# Patient Record
Sex: Female | Born: 1935 | Race: White | Hispanic: No | Marital: Married | Smoking: Never smoker
Health system: Southern US, Community
[De-identification: ages and names within clinical notes are randomized; demographics above are authoritative.]

## PROBLEM LIST (undated history)

## (undated) DIAGNOSIS — G2581 Restless legs syndrome: Secondary | ICD-10-CM

## (undated) DIAGNOSIS — F419 Anxiety disorder, unspecified: Secondary | ICD-10-CM

## (undated) DIAGNOSIS — G629 Polyneuropathy, unspecified: Secondary | ICD-10-CM

## (undated) DIAGNOSIS — D0512 Intraductal carcinoma in situ of left breast: Secondary | ICD-10-CM

## (undated) DIAGNOSIS — Q613 Polycystic kidney, unspecified: Secondary | ICD-10-CM

## (undated) DIAGNOSIS — I10 Essential (primary) hypertension: Secondary | ICD-10-CM

## (undated) DIAGNOSIS — C50912 Malignant neoplasm of unspecified site of left female breast: Secondary | ICD-10-CM

## (undated) HISTORY — PX: COLONOSCOPY: SHX174

## (undated) HISTORY — PX: BREAST LUMPECTOMY: SHX2

## (undated) HISTORY — PX: TUBAL LIGATION: SHX77

## (undated) HISTORY — PX: TONSILLECTOMY: SUR1361

## (undated) HISTORY — PX: ABDOMINAL HYSTERECTOMY: SHX81

## (undated) HISTORY — DX: Intraductal carcinoma in situ of left breast: D05.12

## (undated) HISTORY — PX: BREAST BIOPSY: SHX20

## (undated) HISTORY — PX: APPENDECTOMY: SHX54

---

## 2013-03-06 HISTORY — PX: CARDIAC CATHETERIZATION: SHX172

## 2016-10-11 ENCOUNTER — Other Ambulatory Visit: Payer: Self-pay | Admitting: Hematology & Oncology

## 2016-10-11 DIAGNOSIS — R928 Other abnormal and inconclusive findings on diagnostic imaging of breast: Secondary | ICD-10-CM

## 2016-10-23 ENCOUNTER — Telehealth: Payer: Self-pay | Admitting: Diagnostic Radiology

## 2016-10-23 NOTE — Telephone Encounter (Signed)
I spoke with Dr. Jodelle Green about Valerie Guerrero.  He reports that she has had a left lumpectomy for breast cancer with adjuvant chemotherapy and Letrozole.  He reports a left breast nodule and that she had a diagnostic imaging work-up at [place] with a BiRads 0 and MR of the breasts recommended.  However, she has a Cr of 1.9 with a GFR of 30 and is not a candidate for MR with contrast.  He will schedule her for a Rad Eval at the [facility].  He will have the patient bring her current and previous mammogram and [place] images from [place] on a CD with her.

## 2016-10-24 ENCOUNTER — Ambulatory Visit
Admission: RE | Admit: 2016-10-24 | Discharge: 2016-10-24 | Disposition: A | Discharge status: Home / Self Care | Payer: Medicare (Managed Care) | Source: Ambulatory Visit | Attending: Hematology & Oncology | Admitting: Hematology & Oncology

## 2016-10-24 ENCOUNTER — Other Ambulatory Visit: Payer: Self-pay

## 2016-10-24 ENCOUNTER — Other Ambulatory Visit: Payer: Self-pay | Admitting: Hematology & Oncology

## 2016-10-24 ENCOUNTER — Ambulatory Visit: Payer: Medicare (Managed Care)

## 2016-10-24 ENCOUNTER — Ambulatory Visit
Admission: RE | Admit: 2016-10-24 | Discharge: 2016-10-24 | Disposition: A | Discharge status: Home / Self Care | Payer: Medicare (Managed Care) | Source: Ambulatory Visit

## 2016-10-24 DIAGNOSIS — R928 Other abnormal and inconclusive findings on diagnostic imaging of breast: Secondary | ICD-10-CM

## 2016-10-24 DIAGNOSIS — R921 Mammographic calcification found on diagnostic imaging of breast: Secondary | ICD-10-CM

## 2016-11-15 ENCOUNTER — Encounter (HOSPITAL_COMMUNITY): Payer: Medicare (Managed Care) | Attending: Hematology & Oncology | Admitting: Hematology & Oncology

## 2016-11-15 ENCOUNTER — Telehealth: Payer: Self-pay | Admitting: *Deleted

## 2016-11-15 VITALS — BP 160/64 | HR 63 | Temp 97.5°F | Resp 16 | Ht 63.0 in | Wt 149.4 lb

## 2016-11-15 DIAGNOSIS — D0512 Intraductal carcinoma in situ of left breast: Secondary | ICD-10-CM | POA: Diagnosis not present

## 2016-11-15 DIAGNOSIS — Z853 Personal history of malignant neoplasm of breast: Secondary | ICD-10-CM | POA: Diagnosis not present

## 2016-11-15 DIAGNOSIS — Z8041 Family history of malignant neoplasm of ovary: Secondary | ICD-10-CM | POA: Diagnosis not present

## 2016-11-15 DIAGNOSIS — Z79811 Long term (current) use of aromatase inhibitors: Secondary | ICD-10-CM

## 2016-11-15 NOTE — Patient Instructions (Signed)
[place] at [facility] Discharge Instructions  RECOMMENDATIONS MADE BY THE CONSULTANT AND ANY TEST RESULTS WILL BE SENT TO YOUR REFERRING PHYSICIAN.  You were seen today by Dr. Jenne Pane appointment at [facility] 1/26 @1pm  [place] will be in contact with you about genetic testing results  We will schedule you for a mammogram to re-evaluate clip placement Follow up after surgery with Dr. Donne Hazel  Thank you for choosing [place] at [facility] to provide your oncology and hematology care.  To afford each patient quality time with our provider, please arrive at least 15 minutes before your scheduled appointment time.    If you have a lab appointment with the [facility] please come in thru the  Main Entrance and check in at the main information desk  You need to re-schedule your appointment should you arrive 10 or more minutes late.  We strive to give you quality time with our providers, and arriving late affects you and other patients whose appointments are after yours.  Also, if you no show three or more times for appointments you may be dismissed from the clinic at the providers discretion.     Again, thank you for choosing [facility].  Our hope is that these requests will decrease the amount of time that you wait before being seen by our physicians.       _____________________________________________________________  Should you have questions after your visit to [facility], please contact our office at [phone] between the hours of 8:30 a.m. and 4:30 p.m.  Voicemails left after 4:30 p.m. will not be returned until the following business day.  For prescription refill requests, have your pharmacy contact our office.       Resources For Cancer Patients and their Caregivers ? American Cancer Society: Can assist with transportation, wigs, general needs, runs Look Good Feel Better.         8060313840 ? Cancer Care: Provides financial assistance, online support groups, medication/co-pay assistance.  1-800-813-HOPE 661-815-1406) ? [facility] Assists [place] cancer patients and their families through emotional , educational and financial support.  (614)537-8020 ? [place] DSS Where to apply for food stamps, Medicaid and utility assistance. 989-390-2544 ? [facility]: Transportation to medical appointments. 984-790-5781 ? Social Security Administration: May apply for disability if have a Stage IV cancer. (337)705-6449 864-411-0254 ? [employer], Disability and Transit Services: Assists with nutrition, care and transit needs. [place] Support Programs: @10RELATIVEDAYS @ > Cancer Support Group  2nd Tuesday of the month 1pm-2pm, Journey Room  > Creative Journey  3rd Tuesday of the month 1130am-1pm, Journey Room  > Look Good Feel Better  1st Wednesday of the month 10am-12 noon, Journey Room (Call [place] to register [phone])

## 2016-11-15 NOTE — Progress Notes (Signed)
[place]  CONSULT NOTE  No care team member to display  CHIEF COMPLAINTS/PURPOSE OF CONSULTATION:  DCIS Left Breast, prior lumpectomy site   HISTORY OF PRESENTING ILLNESS:  Valerie Guerrero 83 y.o. female is here because of referral by Dr. Rolm Bookbinder M.D. For new dx of left breast cancer, DCIS.  She notes that 18 years ago she had a L lumpectomy in [place] and took Tamoxifen. She notes that this was for DCIS In December 2016 she underwent a Left lumpectomy with Dr. Audrie Lia and an ALND (she notes all nodes were positive). She was treated with chemotherapy X 4 cycles and radiation. She also reports that there was a suspicious nodule on CT imaging in her lung but it was PET negative.. She then took letrozole.   She has just undergone mammography which has now shown microcalcs in the vicinity of her prior lumpectomy site. R breast appeared WNL. Core needle biopsy performed at the breast center showed high grade DCIS ER+ PR- disease. She has seen Dr. Donne Hazel. Plan is for genetics consultation and then mastectomy potentially L sided only depending upon genetics results. Note that her family history is significant. Her daughter currently has stage IV breast cancer, and was originally diagnosed prior to age 72.   She presents accompanied by her husband and daughter for discussion of left breast cancer and treatment options. I personally reviewed and went over records/notes and imaging with the patient.  States a scar in about the same place as her last lumpectomy. States she went to [facility] for genetic testing  Original lumpectomy was in [place] and took tamoxifen for it.   She has had a hysterectomy but she still has her ovaries. They have tried twice to remove them.   States her granddaughter had kidney cancer at 63. Other granddaughter had a melanoma in situ.   She would rather wait until after surgery to change her Letrozole.  Dr. Joneen Caraway did her biopsy in [place] and  also placed a clip there.   She notes old redness on her breast from radiation.   She has still not heard back in regards to her genetics appointment.   MEDICAL HISTORY:  No past medical history on file.  SURGICAL HISTORY: No past surgical history on file.  SOCIAL HISTORY: Social History   Social History  . Marital status: Married    Spouse name: N/A  . Number of children: N/A  . Years of education: N/A   Occupational History  . Not on file.   Social History Main Topics  . Smoking status: Not on file  . Smokeless tobacco: Not on file  . Alcohol use Not on file  . Drug use: Unknown  . Sexual activity: Not on file   Other Topics Concern  . Not on file   Social History Narrative  . No narrative on file   Social hx: Married 61 years in 02-Jan-2003 children, 1 deceased 6 grand children 5 great grandchildren Used to drive a school bus for 30 years  Non-smoker occassionally alcohol drinking Hobbies: watching television, 20 years ago enjoyed walking  FAMILY HISTORY: No family history on file.   Family hx:  Mom-86 - deceased -ovarian cancer - late onset  Dad - 38 - deceased of broken heart 1 sister - deceased 5 years ago from COPD- had parkinson's, was a chain smoker  States her granddaughter had kidney cancer at 67. Other granddaughter had a melanoma in situ.   ALLERGIES:  is  allergic to tape.  MEDICATIONS:  Current Outpatient Prescriptions  Medication Sig Dispense Refill  . ALPRAZolam (XANAX) 0.25 MG tablet Take 0.25 mg by mouth 2 (two) times daily as needed for anxiety.    . cholecalciferol (VITAMIN D) 1000 units tablet Take 1,000 Units by mouth daily.    Marland Kitchen denosumab (PROLIA) 60 MG/ML SOLN injection Inject 60 mg into the skin every 6 (six) months. Administer in upper arm, thigh, or abdomen    . prochlorperazine (COMPAZINE) 5 MG tablet Take 5 mg by mouth every 6 (six) hours as needed for nausea or vomiting.    Marland Kitchen zolpidem (AMBIEN) 10 MG tablet Take 10 mg  by mouth at bedtime as needed for sleep.    Marland Kitchen CALCIUM 500/D 500-400 MG-UNIT tablet   3  . hydrALAZINE (APRESOLINE) 50 MG tablet Take 50 mg by mouth 2 (two) times daily.  1  . letrozole (FEMARA) 2.5 MG tablet Take 25 mg by mouth daily.  0  . metoprolol (LOPRESSOR) 50 MG tablet Take 50 mg by mouth 2 (two) times daily.  1  . oxyCODONE (OXY IR/ROXICODONE) 5 MG immediate release tablet Take 5 mg by mouth as needed.  0   No current facility-administered medications for this visit.     Review of Systems  Constitutional: Negative.   HENT: Negative.   Eyes: Negative.   Respiratory: Negative.   Cardiovascular: Negative.   Gastrointestinal: Negative.   Genitourinary: Negative.   Musculoskeletal: Negative.   Skin: Negative.        Old redness on her breasts from radiation.  Neurological: Negative.   Endo/Heme/Allergies: Negative.   Psychiatric/Behavioral: Negative.   All other systems reviewed and are negative.  14 point ROS was done and is otherwise as detailed above or in HPI   PHYSICAL EXAMINATION: ECOG PERFORMANCE STATUS: 0 - Asymptomatic  Vitals:   11/15/16 0916  BP: (!) 160/64  Pulse: 63  Resp: 16  Temp: 97.5 F (36.4 C)   Filed Weights   11/15/16 0916  Weight: 149 lb 6.4 oz (67.8 kg)    Physical Exam  Constitutional: She is oriented to person, place, and time and well-developed, well-nourished, and in no distress.  HENT:  Head: Normocephalic and atraumatic.  Mouth/Throat: No oropharyngeal exudate.  Eyes: Conjunctivae and EOM are normal. Pupils are equal, round, and reactive to light. No scleral icterus.  Neck: Normal range of motion. Neck supple.  Cardiovascular: Normal rate, regular rhythm and normal heart sounds.   Pulmonary/Chest: Effort normal and breath sounds normal. No respiratory distress.    Abdominal: Soft. Bowel sounds are normal. She exhibits no distension and no mass. There is no tenderness. There is no rebound and no guarding.  Musculoskeletal: Normal  range of motion. She exhibits no edema.  Lymphadenopathy:    She has no cervical adenopathy.  Neurological: She is alert and oriented to person, place, and time. Gait normal.  Skin: Skin is warm and dry.  Psychiatric: Mood, memory, affect and judgment normal.  Nursing note and vitals reviewed.   LABORATORY DATA:  I have reviewed the data as listed No results found for: WBC, HGB, HCT, MCV, PLT CMP  No results found for: NA, K, CL, CO2, GLUCOSE, BUN, CREATININE, CALCIUM, PROT, ALBUMIN, AST, ALT, ALKPHOS, BILITOT, GFRNONAA, GFRAA   RADIOGRAPHIC STUDIES: I have personally reviewed the radiological images as listed and agreed with the findings in the report. No results found.   Addendum   ADDENDUM REPORT: 10/25/2016 12:04  ADDENDUM: Pathology revealed high grade ductal  carcinoma in situ with calcifications and necrosis in the left breast. This was found to be concordant by Dr. Claudie Revering. Pathology results were discussed with the patient by telephone. The patient reported doing well after the biopsy. Post biopsy instructions and care were reviewed and questions were answered. The patient was encouraged to call [facility] for any additional concerns. The patient requests surgical consultation in [place] and this has been arranged with Dr. Rolm Bookbinder at [facility] on November 02, 2016.  Pathology results reported by Susa Raring RN, BSN on 10/25/2016.   Electronically Signed   By: Claudie Revering M.D.   On: 10/25/2016 12:04   Addended by Enrique Sack, MD on 10/25/2016 2:21 PM    Study Result   CLINICAL DATA:  7 mm group of suspicious microcalcifications in the upper outer left breast at recent mammography. Status post left lumpectomy for invasive ductal carcinoma in this portion of the breast 1 year ago, followed by radiation therapy and chemotherapy.  EXAM: LEFT BREAST STEREOTACTIC CORE NEEDLE  BIOPSY  COMPARISON:  Previous exams.  FINDINGS: The patient and I discussed the procedure of stereotactic-guided biopsy including benefits and alternatives. We discussed the high likelihood of a successful procedure. We discussed the risks of the procedure including infection, bleeding, tissue injury, clip migration, and inadequate sampling. Informed written consent was given. The usual time out protocol was performed immediately prior to the procedure.  Using sterile technique and 1% Lidocaine as local anesthetic, under stereotactic guidance, a 9 gauge vacuum assisted device was used to perform core needle biopsy of the recently demonstrated 7 mm group of microcalcifications in the upper-outer quadrant of the left breast using a cephalad approach. Specimen radiograph was performed showing multiple calcifications in multiple specimens. Specimens with calcifications are identified for pathology.  At the conclusion of the procedure, a coil shaped tissue marker clip was deployed into the biopsy cavity. Follow-up 2-view mammogram was performed and dictated separately.  IMPRESSION: Stereotactic-guided biopsy of a 7 mm group of suspicious microcalcifications in the upper-outer left breast. No apparent complications.  Electronically Signed: By: Claudie Revering M.D. On: 10/24/2016 16:31     Study Result   CLINICAL DATA:  Status post stereotactic guided core needle biopsy of a 7 mm group of suspicious microcalcifications in the upper outer left breast.  EXAM: DIAGNOSTIC LEFT MAMMOGRAM POST STEREOTACTIC BIOPSY  COMPARISON:  Previous exam(s).  FINDINGS: Mammographic images were obtained following stereotactic guided biopsy of a 7 mm group of suspicious microcalcifications in the upper-outer left breast. These demonstrate a coil shaped biopsy marker clip at the location of the biopsied calcifications. All but one of the calcifications are absent following  biopsy.  IMPRESSION: Appropriate clip deployment following left breast stereotactic guided core needle biopsy.  Final Assessment: Post Procedure Mammograms for Marker Placement   Electronically Signed   By: Claudie Revering M.D.   On: 10/24/2016 16:32    PATHOLOGY:     ASSESSMENT & PLAN:  History of locally advanced L breast cancer s/p lumpectomy, chemotherapy, XRT, letrozole History of DCIS, tamoxifen L breast Recurrence High grade DCIS L breast Family history of Breast, ovarian cancer  WE discussed changing her endocrine therapy to aromasin. She wants to currently wait until after her surgery.   I have called to make sure her genetics appointment is scheduled. Final surgical options regarding double mastectomy will be made once genetics are available. She will talk more about this with Dr. Donne Hazel. Currently plan  is for Left mastectomy.   Patient notes an abnormal metal like "marker" that came off the surface of her breast. I have ordered a L diagnostic mammogram to check on the clip that was placed. I anticipate that this is likely fine.   RTC post-surgery with Dr. Donne Hazel.  ORDERS PLACED FOR THIS ENCOUNTER: Orders Placed This Encounter  Procedures  . MM DIAG BREAST TOMO UNI LEFT    MEDICATIONS PRESCRIBED THIS ENCOUNTER: Meds ordered this encounter  Medications  . CALCIUM 500/D 500-400 MG-UNIT tablet    Refill:  3  . hydrALAZINE (APRESOLINE) 50 MG tablet    Sig: Take 50 mg by mouth 2 (two) times daily.    Refill:  1  . metoprolol (LOPRESSOR) 50 MG tablet    Sig: Take 50 mg by mouth 2 (two) times daily.    Refill:  1  . letrozole (FEMARA) 2.5 MG tablet    Sig: Take 25 mg by mouth daily.    Refill:  0  . oxyCODONE (OXY IR/ROXICODONE) 5 MG immediate release tablet    Sig: Take 5 mg by mouth as needed.    Refill:  0  . zolpidem (AMBIEN) 10 MG tablet    Sig: Take 10 mg by mouth at bedtime as needed for sleep.  Marland Kitchen ALPRAZolam (XANAX) 0.25 MG tablet    Sig:  Take 0.25 mg by mouth 2 (two) times daily as needed for anxiety.  . prochlorperazine (COMPAZINE) 5 MG tablet    Sig: Take 5 mg by mouth every 6 (six) hours as needed for nausea or vomiting.  Marland Kitchen denosumab (PROLIA) 60 MG/ML SOLN injection    Sig: Inject 60 mg into the skin every 6 (six) months. Administer in upper arm, thigh, or abdomen  . cholecalciferol (VITAMIN D) 1000 units tablet    Sig: Take 1,000 Units by mouth daily.    This document serves as a record of services personally performed by Ancil Linsey, MD. It was created on her behalf by Shirlean Mylar, a trained medical scribe. The creation of this record is based on the scribe's personal observations and the provider's statements to them. This document has been checked and approved by the attending provider.  I have reviewed the above documentation for accuracy and completeness and I agree with the above.  Kelby Fam. [place]land, M.D. This note was electronically signed.  11/15/2016 3:15 PM

## 2016-11-15 NOTE — Telephone Encounter (Signed)
Received notification from Dr. Whitney Muse pt needed urgent genetics. Called [facility] navigator with an appt for genetics on 11/16/16 at 1pm to give to the pt. Called pt and left vm requesting return call to discuss appt date and time. Contact information provided.

## 2016-11-16 ENCOUNTER — Other Ambulatory Visit: Payer: Self-pay

## 2016-11-16 ENCOUNTER — Ambulatory Visit (HOSPITAL_BASED_OUTPATIENT_CLINIC_OR_DEPARTMENT_OTHER): Payer: Medicare (Managed Care)

## 2016-11-16 DIAGNOSIS — Z8041 Family history of malignant neoplasm of ovary: Secondary | ICD-10-CM | POA: Diagnosis not present

## 2016-11-16 DIAGNOSIS — Z7183 Encounter for nonprocreative genetic counseling: Secondary | ICD-10-CM | POA: Diagnosis not present

## 2016-11-16 DIAGNOSIS — Z853 Personal history of malignant neoplasm of breast: Secondary | ICD-10-CM | POA: Diagnosis not present

## 2016-11-17 DIAGNOSIS — Z8041 Family history of malignant neoplasm of ovary: Secondary | ICD-10-CM | POA: Insufficient documentation

## 2016-11-17 DIAGNOSIS — Z853 Personal history of malignant neoplasm of breast: Secondary | ICD-10-CM | POA: Insufficient documentation

## 2016-11-17 NOTE — Progress Notes (Signed)
REFERRING PROVIDER: Dr. Ancil Linsey  PRIMARY PROVIDER:  No primary care provider on file.  PRIMARY REASON FOR VISIT:  1. Personal history of breast cancer   2. FH: ovarian cancer     HISTORY OF PRESENT ILLNESS:   Ms. Valerie Guerrero, a 83 y.o. female, was seen for a [place] cancer genetics consultation at the request of Dr. Whitney Muse due to a personal and family history of cancer.  Valerie Guerrero presents to clinic today to discuss the possibility of a hereditary predisposition to cancer, genetic testing, and to further clarify her future cancer risks, as well as potential cancer risks for family members. She was accompanied by her husband, Valerie Guerrero.  Around 1998, at the age of 89, Valerie Guerrero reports that she was diagnosed with DCIS of the left breast. This was treated with lumpectomy and Tamoxifen for 5 years. In December 2016, at the age of 49, Valerie Guerrero was diagnosed with invasive breast cancer of the left breast. She reports that 14/14 lymph nodes were positive for disease. She was treated with chemotherapy and radiation. Recently, in December of 2017, at the age of 27, she was again diagnosed with DCIS of the left breast. Her physician is recommending mastectomy but wishes to have the results of genetic testing before proceeding with surgery.   CANCER HISTORY:   No history exists.     HORMONAL RISK FACTORS:   Ovaries intact: yes.  Hysterectomy: yes.  Menopausal status: postmenopausal.  HRT use: not assessed. Colonoscopy: yes; normal.   Past Medical History:  Diagnosis Date  . Breast cancer ([place]) AGE 58   Left breast  . Ductal carcinoma in situ (DCIS) of left breast    AGE 89  . Ductal carcinoma in situ (DCIS) of left breast    Age 37    Past Surgical History:  Procedure Laterality Date  . ABDOMINAL HYSTERECTOMY     30 years ago, ovaries intact  . BREAST LUMPECTOMY Left    20 years ago    Social History   Social History  . Marital status: Married    Spouse name: N/A  .  Number of children: N/A  . Years of education: N/A   Social History Main Topics  . Smoking status: Not on file  . Smokeless tobacco: Not on file  . Alcohol use Not on file  . Drug use: Unknown  . Sexual activity: Not on file   Other Topics Concern  . Not on file   Social History Narrative  . No narrative on file     FAMILY HISTORY:  We obtained a detailed, 4-generation family history.  Significant diagnoses are listed below: Family History  Problem Relation Age of Onset  . Cancer Mother 50    ovarian  . Parkinson's disease Father   . Parkinson's disease Sister   . Cancer Maternal Aunt 78    ovarian  . Cancer Paternal Aunt   . Parkinson's disease Maternal Grandfather   . Cancer Daughter 65    breast  . Cancer Grandchild 25    melanoma  . Cancer Grandchild 30    kidney  . Cancer Cousin     prostate  . Cancer Cousin     ovarian    Valerie Guerrero reports that she had genetic testing for hereditary cancer risks approximately 20 years ago at [facility] but she was unsure of which genes she may have been tested for. Patient's maternal ancestors are of [place] descent, and paternal ancestors are of English descent. There  is no reported Ashkenazi Jewish ancestry.  GENETIC COUNSELING ASSESSMENT: Valerie Guerrero is a 83 y.o. female with a personal and family history which is somewhat suggestive of a predisposition to cancer. We, therefore, discussed and recommended the following at today's visit.   DISCUSSION: We reviewed the characteristics, features and inheritance patterns of hereditary cancer syndromes. We also discussed genetic testing, including the appropriate family members to test, the process of testing, insurance coverage and turn-around-time for results. We discussed the implications of a negative, positive and/or variant of uncertain significant result. We recommended Valerie Guerrero pursue genetic testing for the breast and gynecological gene panel.   Based on Ms. Gilberg's personal and  family history of cancer, she meets medical criteria for genetic testing. Despite that she meets criteria, she may still have an out of pocket cost. We discussed that if her out of pocket cost for testing is over $100, the laboratory will call and confirm whether she wants to proceed with testing.  If the out of pocket cost of testing is less than $100 she will be billed by the genetic testing laboratory.   We reviewed the characteristics, features and inheritance patterns of hereditary cancer syndromes. We also discussed genetic testing, including the appropriate family members to test, the process of testing, insurance coverage and turn-around-time for results. We discussed the implications of a negative, positive and/or variant of uncertain significant result.   PLAN: After considering the risks, benefits, and limitations, Valerie Guerrero  provided informed consent to pursue genetic testing and the blood sample was sent to [facility] for analysis of the breast/gyn cancers panel test. Results should be available within approximately 2 weeks' time, at which point they will be disclosed by telephone to Valerie Guerrero, as will any additional recommendations warranted by these results. Valerie Guerrero will receive a summary of her genetic counseling visit and a copy of her results once available. This information will also be available in Epic. We encouraged Valerie Guerrero to remain in contact with cancer genetics annually so that we can continuously update the family history and inform her of any changes in cancer genetics and testing that may be of benefit for her family. Valerie Guerrero questions were answered to her satisfaction today. Our contact information was provided should additional questions or concerns arise.  Ms.  Guerrero questions were answered to her satisfaction today. Our contact information was provided should additional questions or concerns arise. Thank you for the referral and allowing [place] to share in  the care of your patient.   Benay Pike, MS, [facility] Certified Genetic Counselor  The patient was seen for a total of approximately 45 minutes in face-to-face genetic counseling.  This patient was discussed with Drs. Magrinat, Lindi Adie and/or Burr Medico who agrees with the above.    _______________________________________________________________________ For Office Staff:  Number of people involved in session: 3 Was an Intern/ student involved with case: no

## 2016-11-27 ENCOUNTER — Ambulatory Visit (HOSPITAL_COMMUNITY)
Admission: RE | Admit: 2016-11-27 | Discharge: 2016-11-27 | Disposition: A | Discharge status: Home / Self Care | Payer: Medicare Other | Source: Ambulatory Visit | Attending: Hematology & Oncology | Admitting: Hematology & Oncology

## 2016-11-27 ENCOUNTER — Encounter (HOSPITAL_COMMUNITY): Payer: Self-pay | Admitting: Hematology & Oncology

## 2016-11-27 DIAGNOSIS — D0512 Intraductal carcinoma in situ of left breast: Secondary | ICD-10-CM | POA: Insufficient documentation

## 2016-11-29 ENCOUNTER — Encounter: Payer: Self-pay | Admitting: Genetic Counselor

## 2016-11-29 ENCOUNTER — Ambulatory Visit: Payer: Self-pay | Admitting: Genetic Counselor

## 2016-11-29 ENCOUNTER — Telehealth: Payer: Self-pay | Admitting: Genetic Counselor

## 2016-11-29 DIAGNOSIS — Z853 Personal history of malignant neoplasm of breast: Secondary | ICD-10-CM

## 2016-11-29 DIAGNOSIS — Z1379 Encounter for other screening for genetic and chromosomal anomalies: Secondary | ICD-10-CM | POA: Insufficient documentation

## 2016-11-29 DIAGNOSIS — Z8041 Family history of malignant neoplasm of ovary: Secondary | ICD-10-CM

## 2016-11-29 DIAGNOSIS — D0512 Intraductal carcinoma in situ of left breast: Secondary | ICD-10-CM

## 2016-11-30 ENCOUNTER — Ambulatory Visit: Payer: Self-pay | Admitting: Genetic Counselor

## 2016-11-30 NOTE — Progress Notes (Signed)
[facility]    Patient Name: Valerie Guerrero Patient DOB: [birth year] Patient Age: 83 y.o. Encounter Date: 11/29/2016  Referring Provider: Ancil Linsey, MD  Primary Care Provider: No primary care provider on file.  [name] was called today to discuss genetic test results. Please see the Genetics note from her visit on 11/16/16 for a detailed discussion of her personal and family history.  Genetic Testing: At the time of [name]'s visit, we recommended she pursue genetic testing of multiple genes associated with a hereditary predisposition to cancer. She was tested via the Breast/GYN gene panel offered by [facility], which included sequencing and rearrangement analysis for the following 23 genes:  ATM, BARD1, BRCA1, BRCA2, BRIP1, CDH1, CHEK2, EPCAM, FANCC, MLH1, MSH2, MSH6, MUTYH, NBN, NF1, PALB2, PMS2, POLD1, PTEN, RAD51C, RAD51D, RECQL, and TP53. Testing was normal and did not reveal a mutation in these genes. A copy of the genetic test report will be scanned into Epic under the media tab.  Since the current test is not perfect, it is possible there may be a gene mutation that current testing cannot detect, but that chance is small. We also discussed that it is possible that a different genetic factor, which was not part of this testing or has not yet been discovered, is responsible for the cancer diagnoses in the family. Again, the likelihood of this is low. No additional testing is recommended at this time.   Cancer Screening: Given the personal and family histories, we must interpret these negative results with some caution. Families with features suggestive of hereditary risk tend to have multiple family members with cancer, diagnoses in multiple generations and diagnoses before the age of 36. [name] family exhibits some of these features. This result may simply reflect our current inability to detect all mutations within these genes. It  is also possible there is a different gene, that we have not yet tested, that may be responsible for the cancer in this family.   [name] may be at increased risk for ovarian cancer given her family history of the disease. While there is no effective screening options at this time, some women decide to have their ovaries surgically removed to reduce the chance of ovarian cancer. We recommended [name] discuss these options further with her gynecologist.  Family Members: Given [name]'s and her daughter's breast cancer diagnoses, her other daughters would be considered at increased risk for breast cancer. They may wish to discuss with their own provider whether there is a benefit to having a breast MRI in addition to mammograms. Women should also have a clinical breast exam every 6-12 months, a yearly gynecologic exam and perform monthly breast self-exams. Colon cancer screening is recommended to begin by age 27 in both men and women.  Any relative who had cancer at a young age or had a particularly rare cancer may also wish to pursue genetic testing. Genetic counselors can be located in other cities, by visiting the website of the [employer] (ArtistMovie.se) and Field seismologist for a Dietitian by zip code.   Lastly, cancer genetics is a rapidly advancing field and it is possible that new genetic tests will be appropriate for her in the future. We encourage her to remain in contact with [place] on an annual basis so we can update her personal and family histories, and let her know of advances in cancer genetics that may benefit the  family. Our contact number was provided. [name] is welcome to call anytime with additional questions.    [name], MS, [facility] Certified Genetic Counselor phone: [phone] [email or web address]

## 2016-11-30 NOTE — Telephone Encounter (Signed)
Patient called back and results were discussed.

## 2016-12-04 NOTE — Progress Notes (Signed)
Encounter created in error

## 2016-12-05 ENCOUNTER — Other Ambulatory Visit: Payer: Self-pay | Admitting: General Surgery

## 2016-12-13 ENCOUNTER — Encounter (HOSPITAL_COMMUNITY)
Admission: RE | Admit: 2016-12-13 | Discharge: 2016-12-13 | Disposition: A | Discharge status: Home / Self Care | Payer: Medicare Other | Source: Ambulatory Visit | Attending: General Surgery | Admitting: General Surgery

## 2016-12-13 ENCOUNTER — Encounter (HOSPITAL_COMMUNITY): Payer: Self-pay

## 2016-12-13 DIAGNOSIS — C50912 Malignant neoplasm of unspecified site of left female breast: Secondary | ICD-10-CM | POA: Insufficient documentation

## 2016-12-13 DIAGNOSIS — Z01818 Encounter for other preprocedural examination: Secondary | ICD-10-CM | POA: Diagnosis present

## 2016-12-13 DIAGNOSIS — I10 Essential (primary) hypertension: Secondary | ICD-10-CM | POA: Diagnosis not present

## 2016-12-13 DIAGNOSIS — R9431 Abnormal electrocardiogram [ECG] [EKG]: Secondary | ICD-10-CM | POA: Diagnosis not present

## 2016-12-13 DIAGNOSIS — I444 Left anterior fascicular block: Secondary | ICD-10-CM | POA: Diagnosis not present

## 2016-12-13 HISTORY — DX: Essential (primary) hypertension: I10

## 2016-12-13 HISTORY — DX: Anxiety disorder, unspecified: F41.9

## 2016-12-13 HISTORY — DX: Polyneuropathy, unspecified: G62.9

## 2016-12-13 LAB — BASIC METABOLIC PANEL
ANION GAP: 9 (ref 5–15)
BUN: 16 mg/dL (ref 6–20)
CHLORIDE: 105 mmol/L (ref 101–111)
CO2: 25 mmol/L (ref 22–32)
Calcium: 9.1 mg/dL (ref 8.9–10.3)
Creatinine, Ser: 1.9 mg/dL — ABNORMAL HIGH (ref 0.44–1.00)
GFR calc Af Amer: 28 mL/min — ABNORMAL LOW (ref >60)
GFR calc non Af Amer: 24 mL/min — ABNORMAL LOW (ref >60)
GLUCOSE: 101 mg/dL — AB (ref 65–99)
POTASSIUM: 4.3 mmol/L (ref 3.5–5.1)
Sodium: 139 mmol/L (ref 135–145)

## 2016-12-13 LAB — CBC
HEMATOCRIT: 41.4 % (ref 36.0–46.0)
HEMOGLOBIN: 13.5 g/dL (ref 12.0–15.0)
MCH: 32.8 pg (ref 26.0–34.0)
MCHC: 32.6 g/dL (ref 30.0–36.0)
MCV: 100.7 fL — ABNORMAL HIGH (ref 78.0–100.0)
Platelets: 152 10*3/uL (ref 150–400)
RBC: 4.11 MIL/uL (ref 3.87–5.11)
RDW: 13 % (ref 11.5–15.5)
WBC: 4.9 10*3/uL (ref 4.0–10.5)

## 2016-12-13 NOTE — Pre-Procedure Instructions (Signed)
Valerie Guerrero  12/13/2016      [place], [place] [address] Phone: [phone] Fax: [phone]    Your procedure is scheduled on Wed, Feb 28 @ 8:30 AM  Report to [facility] Admitting at 6:30 AM  Call this number if you have problems the morning of surgery:  [phone]   Remember:  Do not eat food or drink liquids after midnight.  Take these medicines the morning of surgery with A SIP OF WATER Alprazolam(Xanax),Flonase(Fluticasone),Hydralazine(Apresoline),Metoprolol(Lopressor),Pain Pill(if needed),and Compazine(Prochlorperazine)             Stop taking any Vitamins or Herbal Medications now. No Goody's,BC's,Aleve,Advil,Motrin,ibuprofen,or Fish Oil.   Do not wear jewelry, make-up or nail polish.  Do not wear lotions, powders,  perfumes, or deoderant.  Do not shave 48 hours prior to surgery.    Do not bring valuables to the hospital.  [facility] is not responsible for any belongings or valuables.  Contacts, dentures or bridgework may not be worn into surgery.  Leave your suitcase in the car.  After surgery it may be brought to your room.  For patients admitted to the hospital, discharge time will be determined by your treatment team.  Patients discharged the day of surgery will not be allowed to drive home.    [facility]  Before surgery, you can play an important role.  Because skin is not sterile, your skin needs to be as free of germs as possible.  You can reduce the number of germs on you skin by washing with CHG (chlorahexidine gluconate) soap before surgery.  CHG is an antiseptic cleaner which kills germs and bonds with the skin to continue killing germs even after washing.  Please DO NOT use if you have an allergy to CHG or antibacterial soaps.  If your skin becomes reddened/irritated stop using the CHG and inform your nurse when you arrive at Short  Stay.  Do not shave (including legs and underarms) for at least 48 hours prior to the first CHG shower.  You may shave your face.  Please follow these instructions carefully:   1.  Shower with CHG Soap the night before surgery and the                                morning of Surgery.  2.  If you choose to wash your hair, wash your hair first as usual with your       normal shampoo.  3.  After you shampoo, rinse your hair and body thoroughly to remove the                      Shampoo.  4.  Use CHG as you would any other liquid soap.  You can apply chg directly       to the skin and wash gently with scrungie or a clean washcloth.  5.  Apply the CHG Soap to your body ONLY FROM THE NECK DOWN.        Do not use on open wounds or open sores.  Avoid contact with your eyes,       ears, mouth and genitals (private parts).  Wash genitals (private parts)       with your normal soap.  6.  Wash thoroughly, paying special attention to the area where  your surgery        will be performed.  7.  Thoroughly rinse your body with warm water from the neck down.  8.  DO NOT shower/wash with your normal soap after using and rinsing off       the CHG Soap.  9.  Pat yourself dry with a clean towel.            10.  Wear clean pajamas.            11.  Place clean sheets on your bed the night of your first shower and do not        sleep with pets.  Day of Surgery  Do not apply any lotions/deoderants the morning of surgery.  Please wear clean clothes to the hospital/surgery center.     Please read over the following fact sheets that you were given. Pain Booklet, Coughing and Deep Breathing and Surgical Site Infection Prevention

## 2016-12-13 NOTE — Pre-Procedure Instructions (Signed)
Wednesday Waltman  12/13/2016      [place], [place] [address] Phone: [phone] Fax: [phone]    Your procedure is scheduled on Wed, Feb 28 @ 8:30 AM  Report to [facility] Admitting at 6:30 AM  Call this number if you have problems the morning of surgery:  [phone]   Remember:  Do not eat food or drink liquids after midnight.  Take these medicines the morning of surgery with A SIP OF WATER: Alprazolam(Xanax), Flonase(Fluticasone), Hydralazine(Apresoline), Metoprolol(Lopressor), Pain Pill(if needed), and Compazine(Prochlorperazine)             Stop taking any Vitamins or Herbal Medications now. No Goody's,BC's,Aleve,Advil,Motrin,ibuprofen,or Fish Oil.   Do not wear jewelry, make-up or nail polish.  Do not wear lotions, powders,  perfumes, or deodorant.  Do not shave 48 hours prior to surgery.    Do not bring valuables to the hospital.  [facility] is not responsible for any belongings or valuables.  Contacts, dentures or bridgework may not be worn into surgery.  Leave your suitcase in the car.  After surgery it may be brought to your room.  For patients admitted to the hospital, discharge time will be determined by your treatment team.  Patients discharged the day of surgery will not be allowed to drive home.    [facility]  Before surgery, you can play an important role.  Because skin is not sterile, your skin needs to be as free of germs as possible.  You can reduce the number of germs on you skin by washing with CHG (chlorahexidine gluconate) soap before surgery.  CHG is an antiseptic cleaner which kills germs and bonds with the skin to continue killing germs even after washing.  Please DO NOT use if you have an allergy to CHG or antibacterial soaps.  If your skin becomes reddened/irritated stop using the CHG and inform your nurse when you arrive at  Short Stay.  Do not shave (including legs and underarms) for at least 48 hours prior to the first CHG shower.  You may shave your face.  Please follow these instructions carefully:   1.  Shower with CHG Soap the night before surgery and the morning of Surgery.  2.  If you choose to wash your hair, wash your hair first as usual with your normal shampoo.  3.  After you shampoo, rinse your hair and body thoroughly to remove the Shampoo.  4.  Use CHG as you would any other liquid soap.  You can apply chg directl to the skin and wash gently with scrungie or a clean washcloth.  5.  Apply the CHG Soap to your body ONLY FROM THE NECK DOWN.Do not use on open wounds or open sores.  Avoid contact with your eyes,       ears, mouth and genitals (private parts).  Wash genitals (private part  with your normal soap.  6.  Wash thoroughly, paying special attention to the area where your surgery will be performed.  7.  Thoroughly rinse your body with warm water from the neck down.  8.  DO NOT shower/wash with your normal soap after using and rinsing off the CHG Soap.  9.  Pat yourself dry with a clean towel.            10.  Wear clean pajamas.  11.  Place clean sheets on your bed the night of your first shower and do not sleep with pets.  Day of Surgery  Do not apply any lotions/deoderants the morning of surgery.  Please wear clean clothes to the hospital/surgery center.    Please read over the following fact sheets that you were given. Pain Booklet, Coughing and Deep Breathing and Surgical Site Infection Prevention

## 2016-12-13 NOTE — Progress Notes (Signed)
Valerie Guerrero reported that she had a stress test done in the past 5 years, she said that she did not have to have routine visits with cardiologist- Dr Alroy Dust. "I saw Dr Alroy Dust for my blood pressure."  I requested results from [facility]. Patient has a history of Glumerulonephritis- patient doesn't think she has seen a nephrologist.  The Dr said that if I had had it when I was younger that I would be dead."  I requested labs from [facility].

## 2016-12-14 NOTE — Progress Notes (Addendum)
Anesthesia Chart Review: Patient is an 83 year old female scheduled for left total mastectomy on 12/19/2016 by Dr. Donne Hazel. DX: Recurrent left breast cancer. Anesthesia is posted as general with pectoral block.  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  PCP is listed as Griffith Citron, NP [phone]). HEM-ONC is Dr. Ancil Linsey. Has also been seen at [facility] by Dr. Jodelle Green. She has seen cardiologist Dr. Delanna Notice in the past. Last visit was in 2014. She has seen nephrologist Dr. Shirline Frees in the past. Had acute on chronic kidney disease following cardiac cath (Cr peak ~ 3).   Meds include Xanax, Prolina (June/December), Flonase, hydralazine, Femara (on hold), Lopressor, OxyIR, Compazine, Ambien.  BP (!) 158/91   Pulse 62   Temp 36.7 C   Resp 18   Ht 5\' 4"  (1.626 m)   Wt 145 lb (65.8 kg)   SpO2 97%   BMI 24.89 kg/m   EKG 12/13/16: NSR, LAFB, non-specific ST/T wave abnormality.  By 04/14/13 note by Dr. Hewitt Blade: - Carotid U/S 03/07/09: Mild plaque, < 39% stenosis. - Stress Echo 03/05/13 [facility]): Lateral hypokinesis, increased ectopy, 1 mm ST depression. Cath recommended. - Cardiac cath 03/06/13 [facility]): EF not done, normal coronaries.  Preoperative labs noted. H/H 13.5/41.4. Glucose 101. BUN 16, Cr 1.90. This is consistent with comparison labs from 10/23/16 (scanned under Media tab) that show a BUN 20, Cr 1.95. In additional, according to Dr. Milta Deiters notes, patient has had known CKD dating back to at least 2013 with her Cr then ~ 1.50-1.70 with peak up to 3.2 following 2014 cardiac cath.   Will request stress, echo, cath, and EKG from [facility] but based on records  currently available I am anticipating that she can proceed as planned if no acute changes.  George Hugh [facility] Short Stay Center/Anesthesiology Phone [phone] 12/14/2016 6:15 PM  Addendum: 2014 records received from [facility] for 02/2013 admission for chest pain. Cath was essentially normal. Their records also list history of polycystic kidney disease.    - Exercise stress echo 03/06/13: Impression: Abnormal exercise stress test with lateral wall ischemia. Again, possible bounced 3-vessel disease. With generalized weakness, fatigue, SOB, and increased ventricular ectopy and 1.5 mm ST depression, the patient will be scheduled for cardiac cath. (See below for cath summary; I do not see an EF documented on the stress echo.)  - Cardiac cath 03/06/13: Impression: Essentially normal epicardial coronary arteries (apical LAD had 25% stenosis, otherwise normal coronaries). Normal left ventricular systolic function by echocardiogram. Patient will be continued on medical therapy. Risk factor modification.   George Hugh [facility] Short Stay Center/Anesthesiology Phone [phone] 12/17/2016 4:13 PM

## 2016-12-17 ENCOUNTER — Encounter (HOSPITAL_COMMUNITY): Payer: Self-pay

## 2016-12-19 ENCOUNTER — Ambulatory Visit (HOSPITAL_COMMUNITY): Payer: Medicare Other | Admitting: Emergency Medicine

## 2016-12-19 ENCOUNTER — Observation Stay (HOSPITAL_COMMUNITY)
Admission: RE | Admit: 2016-12-19 | Discharge: 2016-12-20 | Disposition: A | Discharge status: Home / Self Care | Payer: Medicare Other | Source: Ambulatory Visit | Attending: General Surgery | Admitting: General Surgery

## 2016-12-19 ENCOUNTER — Encounter (HOSPITAL_COMMUNITY)
Admission: RE | Disposition: A | Discharge status: Home / Self Care | Payer: Self-pay | Source: Ambulatory Visit | Attending: General Surgery

## 2016-12-19 ENCOUNTER — Ambulatory Visit (HOSPITAL_COMMUNITY): Payer: Medicare Other | Admitting: Certified Registered Nurse Anesthetist

## 2016-12-19 ENCOUNTER — Encounter (HOSPITAL_COMMUNITY): Payer: Self-pay | Admitting: *Deleted

## 2016-12-19 DIAGNOSIS — Z923 Personal history of irradiation: Secondary | ICD-10-CM | POA: Insufficient documentation

## 2016-12-19 DIAGNOSIS — I10 Essential (primary) hypertension: Secondary | ICD-10-CM | POA: Diagnosis not present

## 2016-12-19 DIAGNOSIS — Z8041 Family history of malignant neoplasm of ovary: Secondary | ICD-10-CM | POA: Insufficient documentation

## 2016-12-19 DIAGNOSIS — Z9221 Personal history of antineoplastic chemotherapy: Secondary | ICD-10-CM | POA: Diagnosis not present

## 2016-12-19 DIAGNOSIS — Z803 Family history of malignant neoplasm of breast: Secondary | ICD-10-CM | POA: Diagnosis not present

## 2016-12-19 DIAGNOSIS — D0512 Intraductal carcinoma in situ of left breast: Secondary | ICD-10-CM | POA: Diagnosis present

## 2016-12-19 DIAGNOSIS — C50912 Malignant neoplasm of unspecified site of left female breast: Secondary | ICD-10-CM | POA: Diagnosis present

## 2016-12-19 HISTORY — PX: MASTECTOMY: SHX3

## 2016-12-19 HISTORY — PX: TOTAL MASTECTOMY: SHX6129

## 2016-12-19 HISTORY — DX: Polycystic kidney, unspecified: Q61.3

## 2016-12-19 HISTORY — DX: Restless legs syndrome: G25.81

## 2016-12-19 HISTORY — DX: Malignant neoplasm of unspecified site of left female breast: C50.912

## 2016-12-19 SURGERY — MASTECTOMY, SIMPLE
Anesthesia: General | Site: Breast | Laterality: Left

## 2016-12-19 MED ORDER — CHLORHEXIDINE GLUCONATE CLOTH 2 % EX PADS: 6.0000 | MEDICATED_PAD | Freq: Once | CUTANEOUS | Status: DC

## 2016-12-19 MED ORDER — ONDANSETRON HCL 4 MG/2ML IJ SOLN: 4.0000 mg | Freq: Four times a day (QID) | INTRAMUSCULAR | Status: DC | PRN

## 2016-12-19 MED ORDER — LIDOCAINE 2% (20 MG/ML) 5 ML SYRINGE
INTRAMUSCULAR | Status: AC
  Filled 2016-12-19: qty 5

## 2016-12-19 MED ORDER — MIDAZOLAM HCL 2 MG/2ML IJ SOLN
INTRAMUSCULAR | Status: AC
  Filled 2016-12-19: qty 2

## 2016-12-19 MED ORDER — LIDOCAINE 2% (20 MG/ML) 5 ML SYRINGE
INTRAMUSCULAR | Status: DC | PRN
  Administered 2016-12-19: 80 mg via INTRAVENOUS

## 2016-12-19 MED ORDER — LACTATED RINGERS IV SOLN
INTRAVENOUS | Status: DC
  Administered 2016-12-19 (×2): via INTRAVENOUS

## 2016-12-19 MED ORDER — OXYCODONE HCL 5 MG PO TABS
2.5000 mg | ORAL_TABLET | Freq: Two times a day (BID) | ORAL | Status: DC | PRN
  Administered 2016-12-19 – 2016-12-20 (×2): 2.5 mg via ORAL
  Filled 2016-12-19 (×2): qty 1

## 2016-12-19 MED ORDER — FENTANYL CITRATE (PF) 100 MCG/2ML IJ SOLN
INTRAMUSCULAR | Status: AC
  Administered 2016-12-19: 25 ug via INTRAVENOUS
  Filled 2016-12-19: qty 2

## 2016-12-19 MED ORDER — LABETALOL HCL 5 MG/ML IV SOLN
INTRAVENOUS | Status: AC
  Filled 2016-12-19: qty 4

## 2016-12-19 MED ORDER — METHOCARBAMOL 500 MG PO TABS
500.0000 mg | ORAL_TABLET | Freq: Four times a day (QID) | ORAL | Status: DC | PRN
  Administered 2016-12-20: 500 mg via ORAL
  Filled 2016-12-19: qty 1

## 2016-12-19 MED ORDER — FENTANYL CITRATE (PF) 100 MCG/2ML IJ SOLN
25.0000 ug | INTRAMUSCULAR | Status: DC | PRN
  Administered 2016-12-19 (×4): 25 ug via INTRAVENOUS
  Administered 2016-12-19: 50 ug via INTRAVENOUS

## 2016-12-19 MED ORDER — SODIUM CHLORIDE 0.9 % IV SOLN
INTRAVENOUS | Status: DC
  Administered 2016-12-19: 21:00:00 via INTRAVENOUS

## 2016-12-19 MED ORDER — ONDANSETRON HCL 4 MG/2ML IJ SOLN
INTRAMUSCULAR | Status: DC | PRN
  Administered 2016-12-19: 4 mg via INTRAVENOUS

## 2016-12-19 MED ORDER — FENTANYL CITRATE (PF) 100 MCG/2ML IJ SOLN
INTRAMUSCULAR | Status: AC
  Filled 2016-12-19: qty 2

## 2016-12-19 MED ORDER — HEMOSTATIC AGENTS (NO CHARGE) OPTIME
TOPICAL | Status: DC | PRN
  Administered 2016-12-19 (×2): 1 via TOPICAL

## 2016-12-19 MED ORDER — FENTANYL CITRATE (PF) 100 MCG/2ML IJ SOLN
100.0000 ug | Freq: Once | INTRAMUSCULAR | Status: AC
  Administered 2016-12-19: 25 ug via INTRAVENOUS

## 2016-12-19 MED ORDER — BUPIVACAINE-EPINEPHRINE (PF) 0.5% -1:200000 IJ SOLN
INTRAMUSCULAR | Status: DC | PRN
  Administered 2016-12-19: 25 mL

## 2016-12-19 MED ORDER — ONDANSETRON HCL 4 MG/2ML IJ SOLN
INTRAMUSCULAR | Status: AC
  Filled 2016-12-19: qty 2

## 2016-12-19 MED ORDER — FLUTICASONE PROPIONATE 50 MCG/ACT NA SUSP
1.0000 | Freq: Every day | NASAL | Status: DC | PRN
  Filled 2016-12-19: qty 16

## 2016-12-19 MED ORDER — EPHEDRINE SULFATE-NACL 50-0.9 MG/10ML-% IV SOSY
PREFILLED_SYRINGE | INTRAVENOUS | Status: DC | PRN
  Administered 2016-12-19: 15 mg via INTRAVENOUS
  Administered 2016-12-19: 10 mg via INTRAVENOUS

## 2016-12-19 MED ORDER — METOPROLOL TARTRATE 50 MG PO TABS
50.0000 mg | ORAL_TABLET | Freq: Two times a day (BID) | ORAL | Status: DC
  Administered 2016-12-19 – 2016-12-20 (×2): 50 mg via ORAL
  Filled 2016-12-19 (×2): qty 1

## 2016-12-19 MED ORDER — EPHEDRINE 5 MG/ML INJ
INTRAVENOUS | Status: AC
  Filled 2016-12-19: qty 10

## 2016-12-19 MED ORDER — GABAPENTIN 300 MG PO CAPS
300.0000 mg | ORAL_CAPSULE | ORAL | Status: AC
  Administered 2016-12-19: 300 mg via ORAL
  Filled 2016-12-19: qty 1

## 2016-12-19 MED ORDER — ONDANSETRON 4 MG PO TBDP: 4.0000 mg | ORAL_TABLET | Freq: Four times a day (QID) | ORAL | Status: DC | PRN

## 2016-12-19 MED ORDER — 0.9 % SODIUM CHLORIDE (POUR BTL) OPTIME
TOPICAL | Status: DC | PRN
  Administered 2016-12-19: 1000 mL

## 2016-12-19 MED ORDER — PROPOFOL 10 MG/ML IV BOLUS
INTRAVENOUS | Status: AC
  Filled 2016-12-19: qty 20

## 2016-12-19 MED ORDER — NOREPINEPHRINE BITARTRATE 1 MG/ML IV SOLN
0.0000 ug/min | INTRAVENOUS | Status: DC
  Filled 2016-12-19: qty 4

## 2016-12-19 MED ORDER — LABETALOL HCL 5 MG/ML IV SOLN
INTRAVENOUS | Status: DC | PRN
  Administered 2016-12-19: 5 mg via INTRAVENOUS

## 2016-12-19 MED ORDER — ACETAMINOPHEN 325 MG PO TABS: 650.0000 mg | ORAL_TABLET | Freq: Four times a day (QID) | ORAL | Status: DC | PRN

## 2016-12-19 MED ORDER — PROPOFOL 10 MG/ML IV BOLUS
INTRAVENOUS | Status: DC | PRN
  Administered 2016-12-19: 30 mg via INTRAVENOUS
  Administered 2016-12-19: 80 mg via INTRAVENOUS
  Administered 2016-12-19: 50 mg via INTRAVENOUS

## 2016-12-19 MED ORDER — CEFAZOLIN SODIUM-DEXTROSE 2-4 GM/100ML-% IV SOLN
2.0000 g | INTRAVENOUS | Status: AC
  Administered 2016-12-19: 2 g via INTRAVENOUS
  Filled 2016-12-19: qty 100

## 2016-12-19 MED ORDER — ACETAMINOPHEN 650 MG RE SUPP: 650.0000 mg | Freq: Four times a day (QID) | RECTAL | Status: DC | PRN

## 2016-12-19 MED ORDER — SIMETHICONE 80 MG PO CHEW: 40.0000 mg | CHEWABLE_TABLET | Freq: Four times a day (QID) | ORAL | Status: DC | PRN

## 2016-12-19 MED ORDER — ACETAMINOPHEN 500 MG PO TABS
1000.0000 mg | ORAL_TABLET | ORAL | Status: AC
  Administered 2016-12-19: 1000 mg via ORAL
  Filled 2016-12-19: qty 2

## 2016-12-19 MED ORDER — ALPRAZOLAM 0.25 MG PO TABS: 0.2500 mg | ORAL_TABLET | Freq: Two times a day (BID) | ORAL | Status: DC | PRN

## 2016-12-19 MED ORDER — MIDAZOLAM HCL 2 MG/2ML IJ SOLN
2.0000 mg | Freq: Once | INTRAMUSCULAR | Status: AC
  Administered 2016-12-19: 1 mg via INTRAVENOUS

## 2016-12-19 MED ORDER — MORPHINE SULFATE (PF) 2 MG/ML IV SOLN
1.0000 mg | INTRAVENOUS | Status: DC | PRN
  Administered 2016-12-19: 1 mg via INTRAVENOUS
  Filled 2016-12-19: qty 1

## 2016-12-19 MED ORDER — FENTANYL CITRATE (PF) 100 MCG/2ML IJ SOLN
INTRAMUSCULAR | Status: DC | PRN
  Administered 2016-12-19 (×2): 50 ug via INTRAVENOUS

## 2016-12-19 MED ORDER — ZOLPIDEM TARTRATE 5 MG PO TABS
5.0000 mg | ORAL_TABLET | Freq: Every evening | ORAL | Status: DC | PRN
  Administered 2016-12-19: 5 mg via ORAL
  Filled 2016-12-19: qty 1

## 2016-12-19 MED ORDER — HYDRALAZINE HCL 50 MG PO TABS
50.0000 mg | ORAL_TABLET | Freq: Two times a day (BID) | ORAL | Status: DC
  Administered 2016-12-19 – 2016-12-20 (×2): 50 mg via ORAL
  Filled 2016-12-19 (×2): qty 1

## 2016-12-19 SURGICAL SUPPLY — 43 items
BINDER BREAST LRG (GAUZE/BANDAGES/DRESSINGS) ×3 IMPLANT
BINDER BREAST XLRG (GAUZE/BANDAGES/DRESSINGS) IMPLANT
BIOPATCH RED 1 DISK 7.0 (GAUZE/BANDAGES/DRESSINGS) ×2 IMPLANT
BIOPATCH RED 1IN DISK 7.0MM (GAUZE/BANDAGES/DRESSINGS) ×1
CHLORAPREP W/TINT 26ML (MISCELLANEOUS) ×3 IMPLANT
CLOSURE WOUND 1/2 X4 (GAUZE/BANDAGES/DRESSINGS) ×1
COVER SURGICAL LIGHT HANDLE (MISCELLANEOUS) ×3 IMPLANT
DERMABOND ADVANCED (GAUZE/BANDAGES/DRESSINGS) ×4
DERMABOND ADVANCED .7 DNX12 (GAUZE/BANDAGES/DRESSINGS) ×2 IMPLANT
DRAIN CHANNEL 19F RND (DRAIN) ×3 IMPLANT
DRAPE CHEST BREAST 15X10 FENES (DRAPES) ×3 IMPLANT
DRSG PAD ABDOMINAL 8X10 ST (GAUZE/BANDAGES/DRESSINGS) ×3 IMPLANT
DRSG TEGADERM 4X4.75 (GAUZE/BANDAGES/DRESSINGS) ×3 IMPLANT
ELECT BLADE 4.0 EZ CLEAN MEGAD (MISCELLANEOUS) ×3
ELECT CAUTERY BLADE 6.4 (BLADE) ×3 IMPLANT
ELECT REM PT RETURN 9FT ADLT (ELECTROSURGICAL) ×3
ELECTRODE BLDE 4.0 EZ CLN MEGD (MISCELLANEOUS) ×1 IMPLANT
ELECTRODE REM PT RTRN 9FT ADLT (ELECTROSURGICAL) ×1 IMPLANT
EVACUATOR SILICONE 100CC (DRAIN) ×3 IMPLANT
GLOVE BIO SURGEON STRL SZ7 (GLOVE) ×6 IMPLANT
GLOVE BIOGEL PI IND STRL 6 (GLOVE) ×1 IMPLANT
GLOVE BIOGEL PI IND STRL 7.0 (GLOVE) ×1 IMPLANT
GLOVE BIOGEL PI IND STRL 7.5 (GLOVE) ×1 IMPLANT
GLOVE BIOGEL PI INDICATOR 6 (GLOVE) ×2
GLOVE BIOGEL PI INDICATOR 7.0 (GLOVE) ×2
GLOVE BIOGEL PI INDICATOR 7.5 (GLOVE) ×2
GOWN STRL REUS W/ TWL LRG LVL3 (GOWN DISPOSABLE) ×2 IMPLANT
GOWN STRL REUS W/TWL LRG LVL3 (GOWN DISPOSABLE) ×4
HEMOSTAT ARISTA ABSORB 3G PWDR (MISCELLANEOUS) ×6 IMPLANT
KIT BASIN OR (CUSTOM PROCEDURE TRAY) ×3 IMPLANT
KIT ROOM TURNOVER OR (KITS) ×3 IMPLANT
NS IRRIG 1000ML POUR BTL (IV SOLUTION) ×3 IMPLANT
PACK GENERAL/GYN (CUSTOM PROCEDURE TRAY) ×3 IMPLANT
PAD ABD 8X10 STRL (GAUZE/BANDAGES/DRESSINGS) ×6 IMPLANT
PAD ARMBOARD 7.5X6 YLW CONV (MISCELLANEOUS) ×3 IMPLANT
SPECIMEN JAR LARGE (MISCELLANEOUS) ×3 IMPLANT
STRIP CLOSURE SKIN 1/2X4 (GAUZE/BANDAGES/DRESSINGS) ×2 IMPLANT
SUT ETHILON 2 0 FS 18 (SUTURE) ×3 IMPLANT
SUT MNCRL AB 4-0 PS2 18 (SUTURE) ×9 IMPLANT
SUT VIC AB 3-0 SH 18 (SUTURE) ×6 IMPLANT
SUT VIC AB 3-0 SH 8-18 (SUTURE) ×3 IMPLANT
TOWEL OR 17X24 6PK STRL BLUE (TOWEL DISPOSABLE) ×3 IMPLANT
TOWEL OR 17X26 10 PK STRL BLUE (TOWEL DISPOSABLE) IMPLANT

## 2016-12-19 NOTE — Interval H&P Note (Signed)
History and Physical Interval Note:  12/19/2016 8:02 AM  Valerie Guerrero  has presented today for surgery, with the diagnosis of RECURRENT LEFT BREAST CANCER  The various methods of treatment have been discussed with the patient and family. After consideration of risks, benefits and other options for treatment, the patient has consented to  Procedure(s): LEFT TOTAL MASTECTOMY (Left) as a surgical intervention .  The patient's history has been reviewed, patient examined, no change in status, stable for surgery.  I have reviewed the patient's chart and labs.  Questions were answered to the patient's satisfaction.     Corinthian Mizrahi

## 2016-12-19 NOTE — Care Management Note (Signed)
Case Management Note  Patient Details  Name: Valerie Guerrero MRN: [id number] Date of Birth: 06/22/1934  Subjective/Objective:            Spoke with patient and family at the bedside. Patient from home with husband of 76 years. In obs for mastectomy. Patient states she has had a similar surgery in the past at which time she and her husband managed the drain. Patient and family state they feel comfortable managing drain at DC. They anticipate DC in the morning. Daughter states she has shower chair at home and would not require additional DME. No further CM needs identified.          Action/Plan:   Expected Discharge Date:                  Expected Discharge Plan:  Home/Self Care  In-House Referral:     Discharge planning Services  CM Consult  Post Acute Care Choice:    Choice offered to:     DME Arranged:    DME Agency:     HH Arranged:    HH Agency:     Status of Service:  Completed, signed off  If discussed at [employer] of Stay Meetings, dates discussed:    Additional Comments:  Carles Collet, RN 12/19/2016, 2:31 PM

## 2016-12-19 NOTE — Care Management Obs Status (Signed)
[place] NOTIFICATION   Patient Details  Name: Valerie Guerrero MRN: [id number] Date of Birth: [birth year]   Medicare Observation Status Notification Given:  Yes    Carles Collet, RN 12/19/2016, 2:30 PM

## 2016-12-19 NOTE — Op Note (Signed)
Preoperative diagnosis: recurrent left breast cancer Postoperative diagnosis: same as above Procedure: left total mastectomy Surgeon: Dr. Serita Grammes Anesthesia: Gen. with pectoral block Estimated blood loss: 50 mL Complications: None Drains: 19 [place] Blake drain Specimens: Left breast marked with short stitch superior, long stitch lateral Sponge and needle count correct at end of operation Disposition to recovery stable  Indications: This is an 83 year old female with negative genetic testing who has a recurrent left breast cancer. She has previously been treated with a lumpectomy then underwent an additional lumpectomy with radiation with chemotherapy and axillary lymph node dissection in the not too distant past. She now has a recurrence of high-grade ductal carcinoma in situ. We discussed her options and decided to proceed with a left mastectomy.  Procedure: After informed consent was obtained she was taken to the operating room. She had undergone a pectoral block. Antibiotics were administered. SCDs were in place. She then underwent general anesthesia without complication. She was prepped and draped in the standard sterile surgical fashion. A surgical timeout was then performed.  I made a large elliptical incision to encompass the nipple and areola. I then created flaps to the clavicle, parasternal region, inframammary fold, and latissimus laterally. She had a fair amount of scarring as well as radiation changes that made this somewhat difficult. I tried to leave all the dermal fat on the skin to allow it to heal. I then removed the breast from the pectoralis muscle to include the pectoralis fascia. The pectoral muscle had the expected changes from postoperative radiation from her last surgery. I then marked this as above and passed off the table as a specimen. Hemostasis was then obtained. I then placed a 18 [place] Blake drain and secured this with a 2-0 nylon suture. I closed the dermis  with 3-0 Vicryl. Laterally due to some excess skin I did remove some more and I closed this in a Y fashion to decrease the amount of excess tissue. I then closed the skin with a 4-0 Monocryl. Steri-Strips were applied. A breast binder was placed. She tolerated this was extended and transferred to recovery in stable condition.

## 2016-12-19 NOTE — Anesthesia Postprocedure Evaluation (Addendum)
Anesthesia Post Note  Patient: Valerie Guerrero  Procedure(s) Performed: Procedure(s) (LRB): LEFT TOTAL MASTECTOMY (Left)  Patient location during evaluation: PACU Anesthesia Type: General and Regional Level of consciousness: awake and alert Pain management: pain level controlled Vital Signs Assessment: post-procedure vital signs reviewed and stable Respiratory status: spontaneous breathing, nonlabored ventilation, respiratory function stable and patient connected to nasal cannula oxygen Cardiovascular status: blood pressure returned to baseline and stable Postop Assessment: no signs of nausea or vomiting Anesthetic complications: no       Last Vitals:  Vitals:   12/19/16 1045 12/19/16 1100  BP:  (!) 156/82  Pulse: 62 60  Resp: 11 10  Temp:      Last Pain:  Vitals:   12/19/16 0650  TempSrc: Oral                 Willadene Mounsey,JAMES TERRILL

## 2016-12-19 NOTE — Anesthesia Procedure Notes (Addendum)
Anesthesia Regional Block: Pectoralis block   Pre-Anesthetic Checklist: ,, timeout performed, Correct Patient, Correct Site, Correct Laterality, Correct Procedure, Correct Position, site marked, Risks and benefits discussed,  Surgical consent,  Pre-op evaluation,  At surgeon's request and post-op pain management  Laterality: Left and Upper  Prep: chloraprep       Needles:   Needle Type: Echogenic Stimulator Needle     Needle Length: 9cm  Needle Gauge: 21   Needle insertion depth: 6 cm   Additional Needles:   Procedures: ultrasound guided,,,,,,,,  Narrative:  Start time: 12/19/2016 8:10 AM End time: 12/19/2016 8:26 AM Injection made incrementally with aspirations every 5 mL.  Performed by: Personally  Anesthesiologist: Derrian Rodak

## 2016-12-19 NOTE — Transfer of Care (Signed)
Immediate Anesthesia Transfer of Care Note  Patient: Valerie Guerrero  Procedure(s) Performed: Procedure(s): LEFT TOTAL MASTECTOMY (Left)  Patient Location: PACU  Anesthesia Type:GA combined with regional for post-op pain  Level of Consciousness: awake, alert , oriented and patient cooperative  Airway & Oxygen Therapy: Patient Spontanous Breathing and Patient connected to face mask oxygen  Post-op Assessment: Report given to RN and Post -op Vital signs reviewed and stable  Post vital signs: Reviewed and stable  Last Vitals:  Vitals:   12/19/16 0650  BP: (!) 168/85  Pulse: 66  Resp: 18  Temp: 36.5 C    Last Pain:  Vitals:   12/19/16 0650  TempSrc: Oral         Complications: No apparent anesthesia complications

## 2016-12-19 NOTE — Anesthesia Procedure Notes (Signed)
Procedure Name: LMA Insertion Date/Time: 12/19/2016 8:51 AM Performed by: Everlean Cherry A Pre-anesthesia Checklist: Patient identified, Emergency Drugs available, Suction available and Patient being monitored Patient Re-evaluated:Patient Re-evaluated prior to inductionOxygen Delivery Method: Circle system utilized Preoxygenation: Pre-oxygenation with 100% oxygen Intubation Type: IV induction Ventilation: Mask ventilation without difficulty LMA: LMA inserted LMA Size: 3.0 Number of attempts: 2 Placement Confirmation: positive ETCO2 and breath sounds checked- equal and bilateral Tube secured with: Tape Dental Injury: Teeth and Oropharynx as per pre-operative assessment  Comments: Attempted LMA 4 insertion.  Unsuccessful.  LMA 3 inserted.

## 2016-12-19 NOTE — Anesthesia Preprocedure Evaluation (Addendum)
Anesthesia Evaluation  Patient identified by MRN, date of birth, ID band  Reviewed: Allergy & Precautions, NPO status , Patient's Chart, lab work & pertinent test results  Airway Mallampati: II  TM Distance: <3 FB Neck ROM: Full    Dental  (+) Partial Upper, Teeth Intact, Dental Advisory Given   Pulmonary neg pulmonary ROS,    breath sounds clear to auscultation       Cardiovascular hypertension,  Rhythm:Regular Rate:Normal     Neuro/Psych negative neurological ROS     GI/Hepatic negative GI ROS, Neg liver ROS,   Endo/Other  negative endocrine ROS  Renal/GU      Musculoskeletal   Abdominal   Peds  Hematology negative hematology ROS (+)   Anesthesia Other Findings   Reproductive/Obstetrics                            Anesthesia Physical Anesthesia Plan  ASA: II  Anesthesia Plan: General   Post-op Pain Management:    Induction: Intravenous  Airway Management Planned: LMA  Additional Equipment:   Intra-op Plan:   Post-operative Plan: Extubation in OR  Informed Consent: I have reviewed the patients History and Physical, chart, labs and discussed the procedure including the risks, benefits and alternatives for the proposed anesthesia with the patient or authorized representative who has indicated his/her understanding and acceptance.   Dental advisory given  Plan Discussed with:   Anesthesia Plan Comments:         Anesthesia Quick Evaluation

## 2016-12-19 NOTE — H&P (Signed)
83 yof who has newly[ diagnosed left breast dcis referred by Dr Enrique Sack. she has significant history. she has undergone bilateral excisional biopsies in past. 18 years ago underwent left lumpectomy and antiestrogen at [facility] for dcis. in dec 2016 she appears to have had left breast lumpectomy and alnd ( she states 14 nodes removed) then underwent chemotherapy and radiotherapy. this was mm after treatment. she does not have mass or dc. she has fh of mom and multiple others with ovarian cancer, breast cancer and prostate cancer. she states last genetic testing was 18 years ago at [place]. her density category is b. she has in ruoq a 7x5x4 mm group of calcs these appear.she underwent core biopsy that is hg dcis that is er pos at 100% pr negative. she is here to discuss options   Past Surgical History Breast Biopsy  Left. Breast Mass; Local Excision  Left. Hysterectomy (not due to cancer) - Partial  Tonsillectomy   Diagnostic Studies History Mammogram  within last year Pap Smear  1-5 years ago  Allergies No Known Allergies   Medication History  OxyCODONE HCl (5MG  Tablet, Oral as needed) Active. HydrALAZINE HCl (50MG  Tablet, Oral two times daily) Active. Metoprolol Tartrate (50MG  Tablet, Oral two times daily) Active. Calcium 500/D (500-400MG -UNIT Tablet Chewable, Oral daily) Active. Letrozole (2.5MG  Tablet, Oral daily) Active. Ambien (10MG  Tablet, Oral at bedtime, as needed) Active. Xanax (0.25MG  Tablet, Oral two times daily) Active. Prochlorperazine (5MG  Suppository, Rectal as needed) Active. Vitamin D (Cholecalciferol) (1000UNIT Tablet, Oral daily) Active. Medications Reconciled  Social History  Alcohol use  Moderate alcohol use. Caffeine use  Coffee. No drug use  Tobacco use  Never smoker.  Family History Alcohol Abuse  Brother, Son. Breast Cancer  Daughter, Sister. Cervical Cancer  Mother. Heart Disease  Mother. Hypertension  Father,  Mother. Melanoma  Father. Migraine Headache  Mother. Ovarian Cancer  Mother.  Pregnancy / Birth History  Age at menarche  83 years. Age of menopause  35-55 Gravida  4 Irregular periods  Maternal age  77-25 Para  88  Other Problems  Breast Cancer  High blood pressure  Lump In Breast    Review of Systems  General Not Present- Appetite Loss, Chills, Fatigue, Fever, Night Sweats, Weight Gain and Weight Loss. Skin Not Present- Change in Wart/Mole, Dryness, Hives, Jaundice, New Lesions, Non-Healing Wounds, Rash and Ulcer. HEENT Not Present- Earache, Hearing Loss, Hoarseness, Nose Bleed, Oral Ulcers, Ringing in the Ears, Seasonal Allergies, Sinus Pain, Sore Throat, Visual Disturbances, Wears glasses/contact lenses and Yellow Eyes. Respiratory Not Present- Bloody sputum, Chronic Cough, Difficulty Breathing, Snoring and Wheezing. Breast Present- Breast Mass. Not Present- Breast Pain, Nipple Discharge and Skin Changes. Cardiovascular Not Present- Chest Pain, Difficulty Breathing Lying Down, Leg Cramps, Palpitations, Rapid Heart Rate, Shortness of Breath and Swelling of Extremities. Gastrointestinal Not Present- Abdominal Pain, Bloating, Bloody Stool, Change in Bowel Habits, Chronic diarrhea, Constipation, Difficulty Swallowing, Excessive gas, Gets full quickly at meals, Hemorrhoids, Indigestion, Nausea, Rectal Pain and Vomiting. Female Genitourinary Not Present- Frequency, Nocturia, Painful Urination, Pelvic Pain and Urgency. Musculoskeletal Present- Back Pain. Not Present- Joint Pain, Joint Stiffness, Muscle Pain, Muscle Weakness and Swelling of Extremities. Neurological Not Present- Decreased Memory, Fainting, Headaches, Numbness, Seizures, Tingling, Tremor, Trouble walking and Weakness. Psychiatric Not Present- Anxiety, Bipolar, Change in Sleep Pattern, Depression, Fearful and Frequent crying. Endocrine Not Present- Cold Intolerance, Excessive Hunger, Hair Changes, Heat  Intolerance, Hot flashes and New Diabetes.  Vitals  Weight: 149.2 lb Height: 65in Body Surface Area: 1.75  m Body Mass Index: 24.83 kg/m  Temp.: 97.53F  Pulse: 67 (Regular)  BP: 140/90 (Sitting, Left Arm, Standard) Physical Exam  General Mental Status-Alert. Orientation-Oriented X3. Eye Sclera/Conjunctiva - Bilateral-No scleral icterus. Chest and Lung Exam Chest and lung exam reveals -on auscultation, normal breath sounds, no adventitious sounds and normal vocal resonance. Breast Nipples-No Discharge. Breast Lump-No Palpable Breast Mass. Note: left sided radiation changes, multiple incisions healed right side with multiple incisions healed Cardiovascular Cardiovascular examination reveals -normal heart sounds, regular rate and rhythm with no murmurs. Lymphatic Head & Neck General Head & Neck Lymphatics: Bilateral - Description - Normal. Axillary General Axillary Region: Bilateral - Description - Normal. Note: no [place] adenopathy  Assessment & Plan  BREAST NEOPLASM, TIS (DCIS), LEFT (D05.12) Story: Genetic testing, med onc referral to Dr Whitney Muse, left mastectomy (she will decide on right after genetics). she really wanted bilateral mastectomy but we had long discussion about this likely not prolonging survivial and quality of life issues at this point with that much surgery. We discussed the staging and pathophysiology of breast cancer. We discussed all of the different options for treatment for breast cancer including surgery, chemotherapy, radiation therapy, Herceptin, and antiestrogen therapy. I will omit sn biopsy as this appears to be dcis and she has had alnd it sounds like. We discussed mastectomy (removal of whole breast) She does not want reconstruction. We discussed the risks of operation including bleeding, infection, possible reoperation. She understands her further therapy will be based on what her stages at the time of her operation. she is  going to see med onc, await genetic testing,

## 2016-12-19 NOTE — Discharge Instructions (Signed)
[facility] °[phone] ° °MASTECTOMY: POST OP INSTRUCTIONS ° °Always review your discharge instruction sheet given to you by the facility where your surgery was performed. °IF YOU HAVE DISABILITY OR FAMILY LEAVE FORMS, YOU MUST BRING THEM TO THE OFFICE FOR PROCESSING.   °DO NOT GIVE THEM TO YOUR DOCTOR. °A prescription for pain medication may be given to you upon discharge.  Take your pain medication as prescribed, if needed.  If narcotic pain medicine is not needed, then you may take acetaminophen (Tylenol), naprosyn (Alleve) or ibuprofen (Advil) as needed. °1. Take your usually prescribed medications unless otherwise directed. °2. If you need a refill on your pain medication, please contact your pharmacy.  They will contact our office to request authorization.  Prescriptions will not be filled after 5pm or on week-ends. °3. You should follow a light diet the first few days after arrival home, such as soup and crackers, etc.  Resume your normal diet the day after surgery. °4. Most patients will experience some swelling and bruising on the chest and underarm.  Ice packs will help.  Swelling and bruising can take several days to resolve. Wear the binder day and night until you return to the office.  °5. It is common to experience some constipation if taking pain medication after surgery.  Increasing fluid intake and taking a stool softener (such as Colace) will usually help or prevent this problem from occurring.  A mild laxative (Milk of Magnesia or Miralax) should be taken according to package instructions if there are no bowel movements after 48 hours. °6. Unless discharge instructions indicate otherwise, leave your bandage dry and in place until your next appointment in 3-5 days.  You may take a limited sponge bath.  No tube baths or showers until the drains are removed.  You may have steri-strips (small skin tapes) in place directly over the incision.  These strips should be left on the  skin for 7-10 days. If you have glue it will come off in next couple week.  Any sutures will be removed at an office visit °7. DRAINS:  If you have drains in place, it is important to keep a list of the amount of drainage produced each day in your drains.  Before leaving the hospital, you should be instructed on drain care.  Call our office if you have any questions about your drains. I will remove your drains when they put out less than 30 cc or ml for 2 consecutive days. °8. ACTIVITIES:  You may resume regular (light) daily activities beginning the next day--such as daily self-care, walking, climbing stairs--gradually increasing activities as tolerated.  You may have sexual intercourse when it is comfortable.  Refrain from any heavy lifting or straining until approved by your doctor. °a. You may drive when you are no longer taking prescription pain medication, you can comfortably wear a seatbelt, and you can safely maneuver your car and apply brakes. °b. RETURN TO WORK:  __________________________________________________________ °9. You should see your doctor in the office for a follow-up appointment approximately 3-5 days after your surgery.  Your doctor’s nurse will typically make your follow-up appointment when she calls you with your pathology report.  Expect your pathology report 3-4business days after surgery. °10. OTHER INSTRUCTIONS: ______________________________________________________________________________________________ ____________________________________________________________________________________________ °WHEN TO CALL YOUR DR Akaya Proffit: °1. Fever over 101.0 °2. Nausea and/or vomiting °3. Extreme swelling or bruising °4. Continued bleeding from incision. °5. Increased pain, redness, or drainage from the incision. °The clinic staff is available   to answer your questions during regular business hours.  Please don’t hesitate to call and ask to speak to one of the nurses for clinical concerns.  If  you have a medical emergency, go to the nearest emergency room or call 911.  A surgeon from [facility] is always on call at the hospital. °[address], [address], [place], [place]  [address] ? [address] [address], , [place]   [address] °[phone] ? [phone] ? FAX [phone] °Web site: [email or web address] ° °

## 2016-12-20 ENCOUNTER — Encounter (HOSPITAL_COMMUNITY): Payer: Self-pay | Admitting: General Surgery

## 2016-12-20 DIAGNOSIS — D0512 Intraductal carcinoma in situ of left breast: Secondary | ICD-10-CM | POA: Diagnosis not present

## 2016-12-20 MED ORDER — METHOCARBAMOL 500 MG PO TABS: 500.0000 mg | ORAL_TABLET | Freq: Four times a day (QID) | ORAL | 0 refills | Status: AC | PRN

## 2016-12-20 MED ORDER — OXYCODONE HCL 5 MG PO TABS: 2.5000 mg | ORAL_TABLET | Freq: Two times a day (BID) | ORAL | 0 refills | Status: AC | PRN

## 2016-12-20 MED ORDER — OXYCODONE HCL 5 MG PO TABS: 2.5000 mg | ORAL_TABLET | Freq: Two times a day (BID) | ORAL | 0 refills | Status: DC | PRN

## 2016-12-20 NOTE — Discharge Summary (Signed)
Physician Discharge Summary  Patient ID: Valerie Guerrero MRN: [id number] DOB/AGE: 1934/06/06 83 y.o.  Admit date: 12/19/2016 Discharge date: 12/20/2016  Admission Diagnoses: Recurrent left breast cancer  Discharge Diagnoses:  Active Problems:   Recurrent breast cancer, left [facility])   Discharged Condition: good  Hospital Course: 51 yof who has recurrent left breast cancer. She has undergone left total mastectomy for lg dcis.  She has done well and will be discharged home today.   Consults: None  Significant Diagnostic Studies: none  Treatments: surgery: left total mastectomy  Discharge Exam: Blood pressure 122/64, pulse 69, temperature 98.6 F (37 C), temperature source Oral, resp. rate 18, weight 65.8 kg (145 lb), SpO2 95 %. Incision/Wound:no hematoma, clean without infection, drain serosang  Disposition: home  Allergies as of 12/20/2016      Reactions   Tape Other (See Comments)   Blisters       Medication List    TAKE these medications   ALPRAZolam 0.25 MG tablet Commonly known as:  XANAX Take 0.25 mg by mouth 2 (two) times daily.   CALCIUM 500 + D PO Take 1 tablet by mouth daily.   cholecalciferol 1000 units tablet Commonly known as:  VITAMIN D Take 1,000 Units by mouth daily.   denosumab 60 MG/ML Soln injection Commonly known as:  PROLIA Inject 60 mg into the skin every 6 (six) months. Administer in upper arm, thigh, or abdomen   fluticasone 50 MCG/ACT nasal spray Commonly known as:  FLONASE Place 1 spray into both nostrils daily as needed for allergies or rhinitis.   hydrALAZINE 50 MG tablet Commonly known as:  APRESOLINE Take 50 mg by mouth 2 (two) times daily.   letrozole 2.5 MG tablet Commonly known as:  FEMARA Take 2.5 mg by mouth daily.   methocarbamol 500 MG tablet Commonly known as:  ROBAXIN Take 1 tablet (500 mg total) by mouth every 6 (six) hours as needed for muscle spasms.   metoprolol 50 MG tablet Commonly known as:  LOPRESSOR Take 50  mg by mouth 2 (two) times daily.   oxyCODONE 5 MG immediate release tablet Commonly known as:  Oxy IR/ROXICODONE Take 2.5 mg by mouth 2 (two) times daily as needed for moderate pain or severe pain. What changed:  Another medication with the same name was added. Make sure you understand how and when to take each.   oxyCODONE 5 MG immediate release tablet Commonly known as:  Oxy IR/ROXICODONE Take 0.5 tablets (2.5 mg total) by mouth 2 (two) times daily as needed for moderate pain or severe pain. What changed:  You were already taking a medication with the same name, and this prescription was added. Make sure you understand how and when to take each.   prochlorperazine 5 MG tablet Commonly known as:  COMPAZINE Take 5 mg by mouth every 6 (six) hours as needed for nausea or vomiting.   zolpidem 10 MG tablet Commonly known as:  AMBIEN Take 10 mg by mouth at bedtime.      Follow-up Information    Eastyn Skalla, MD Follow up in 2 week(s).   Specialty:  General Surgery Contact information: [address] [place] [address] [phone]           Signed: Rolm Bookbinder 12/20/2016, 7:23 AM

## 2016-12-24 ENCOUNTER — Ambulatory Visit (HOSPITAL_COMMUNITY): Payer: Medicare (Managed Care)

## 2017-03-22 NOTE — Addendum Note (Signed)
Addendum  created 03/22/17 1212 by Rica Koyanagi, MD   Sign clinical note

## 2017-06-17 IMAGING — MG MM CLIP PLACEMENT
2 series · 2 of 2 positions shown · non-contrast
Comparison: Previous exam(s).

CLINICAL DATA: Status post stereotactic guided core needle biopsy
of a 7 mm group of suspicious microcalcifications in the upper outer
left breast.

EXAM:
DIAGNOSTIC LEFT MAMMOGRAM POST STEREOTACTIC BIOPSY

[L CC]
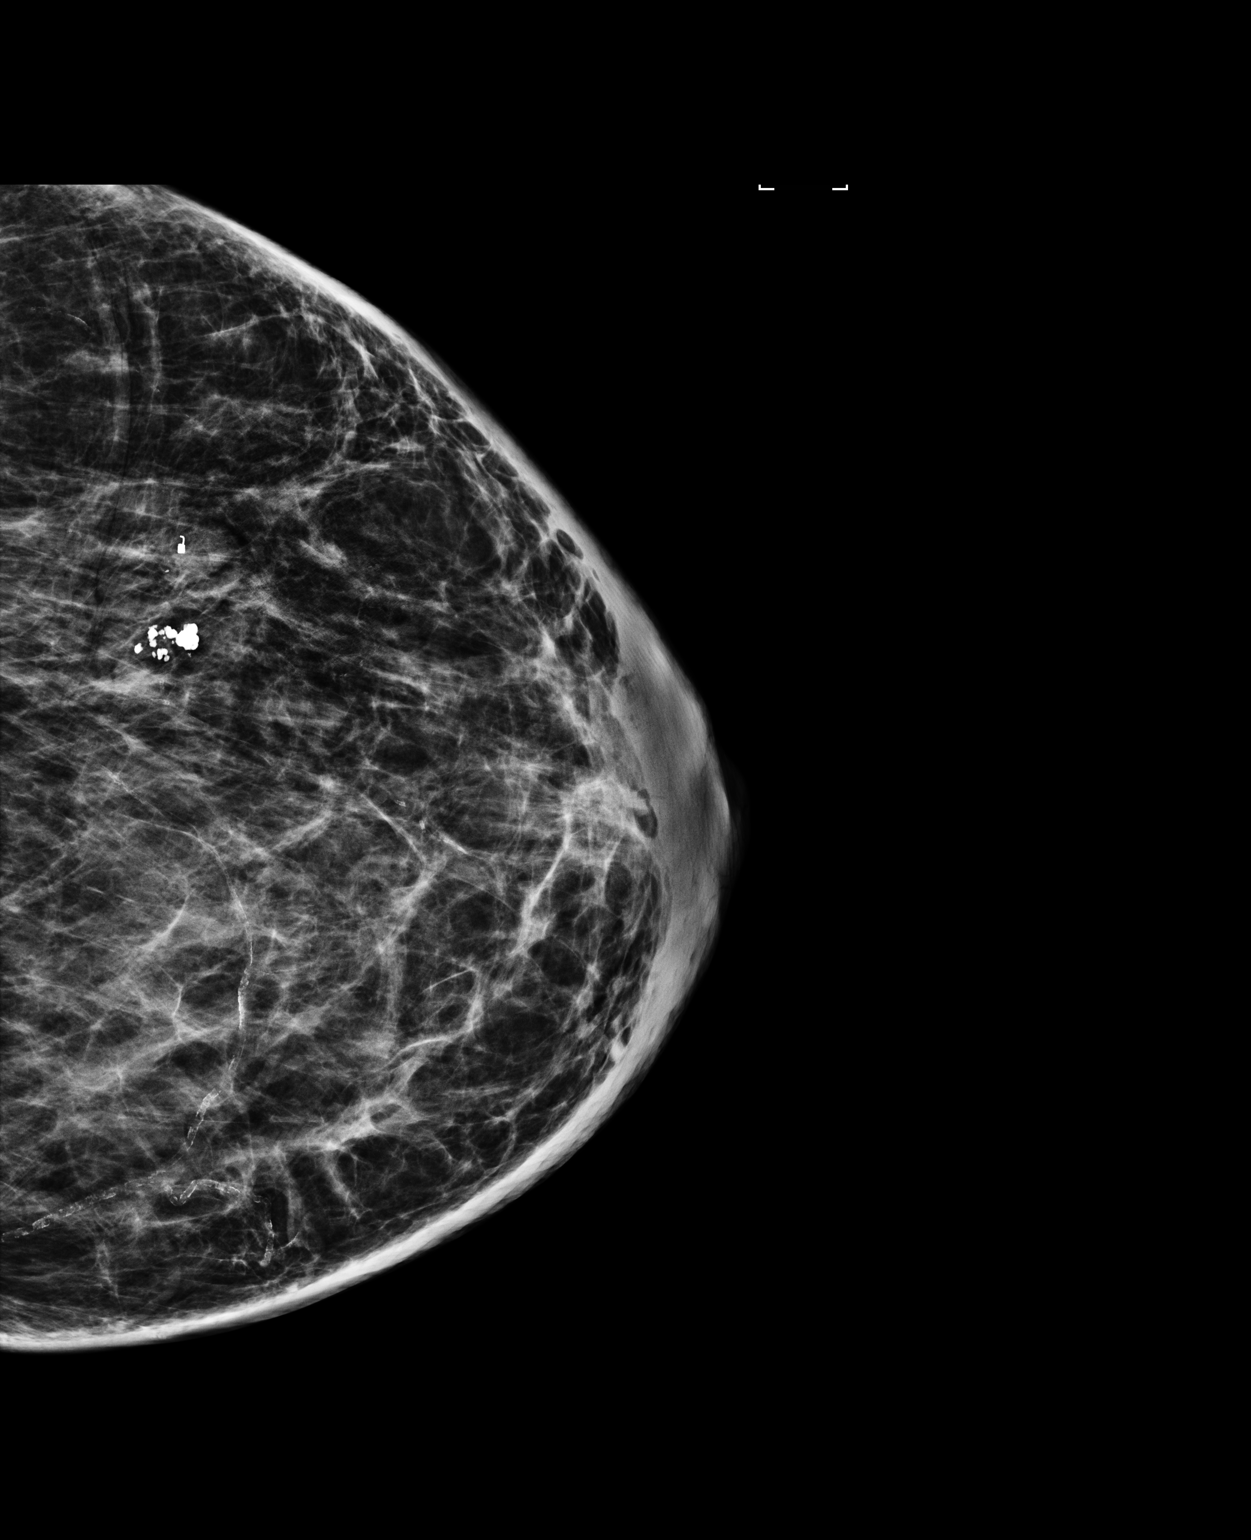

[L ML]
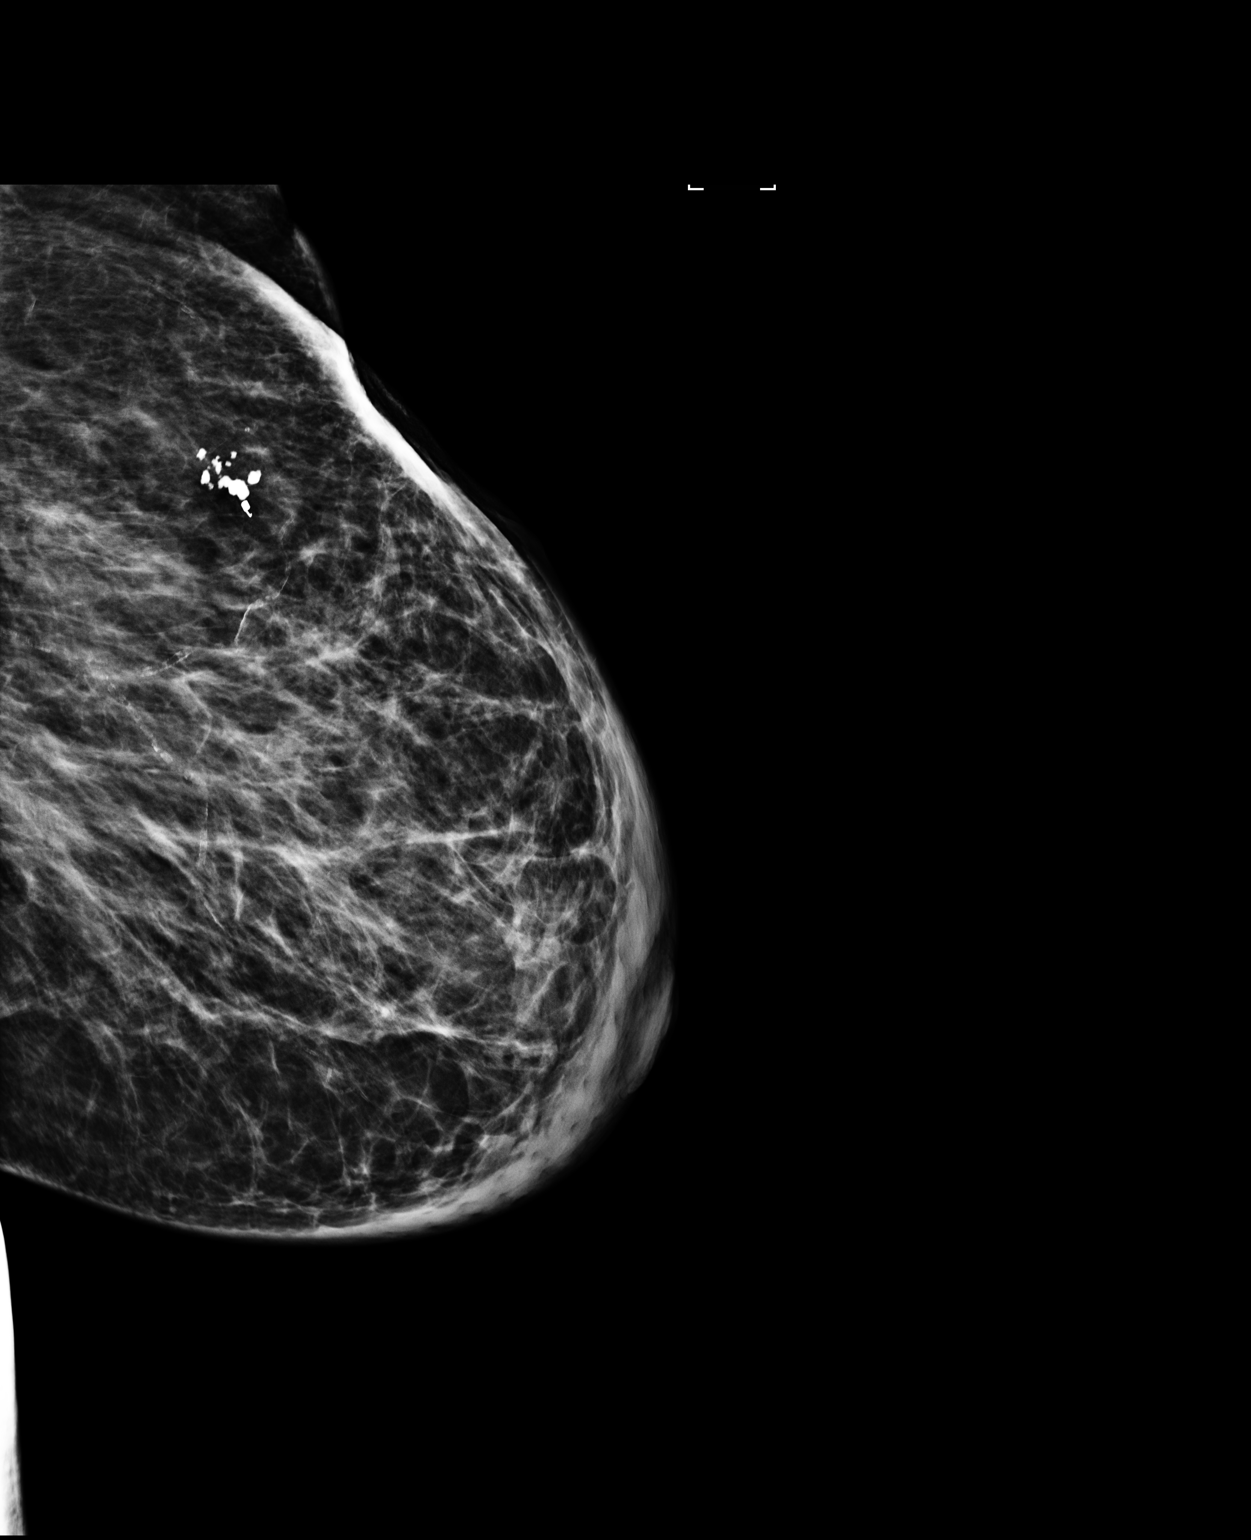

[2 of 2 positions shown; findings below may reference images not displayed]

FINDINGS: Mammographic images were obtained following stereotactic guided
biopsy of a 7 mm group of suspicious microcalcifications in the
upper-outer left breast. These demonstrate a coil shaped biopsy
marker clip at the location of the biopsied calcifications. All but
one of the calcifications are absent following biopsy.
IMPRESSION: Appropriate clip deployment following left breast stereotactic
guided core needle biopsy.

Final Assessment: Post Procedure Mammograms for Marker Placement

## 2017-06-17 IMAGING — MG STEREOTACTIC CORE NEEDLE BIOPSY
8 of 9 series · 8 of 17 positions shown · non-contrast
Comparison: Previous exams.

ADDENDUM:
Pathology revealed high grade ductal carcinoma in situ with
calcifications and necrosis in the left breast. This was found to be
concordant by Dr. Ramirito Sagrav. Pathology results were discussed with
the patient by telephone. The patient reported doing well after the
biopsy. Post biopsy instructions and care were reviewed and
questions were answered. The patient was encouraged to call The
patient requests surgical consultation in [HOSPITAL] and this has
been arranged with Dr. Giorgi Jumper at [REDACTED] on November 02, 2016.

Pathology results reported by Crnogorski Jedrilicarski Benussi RN, BSN on 10/25/2016.
CLINICAL DATA: 7 mm group of suspicious microcalcifications in the
upper outer left breast at recent mammography. Status post left
lumpectomy for invasive ductal carcinoma in this portion of the
breast 1 year ago, followed by radiation therapy and chemotherapy.
EXAM:
LEFT BREAST STEREOTACTIC CORE NEEDLE BIOPSY

[L CC (1 of 7)]
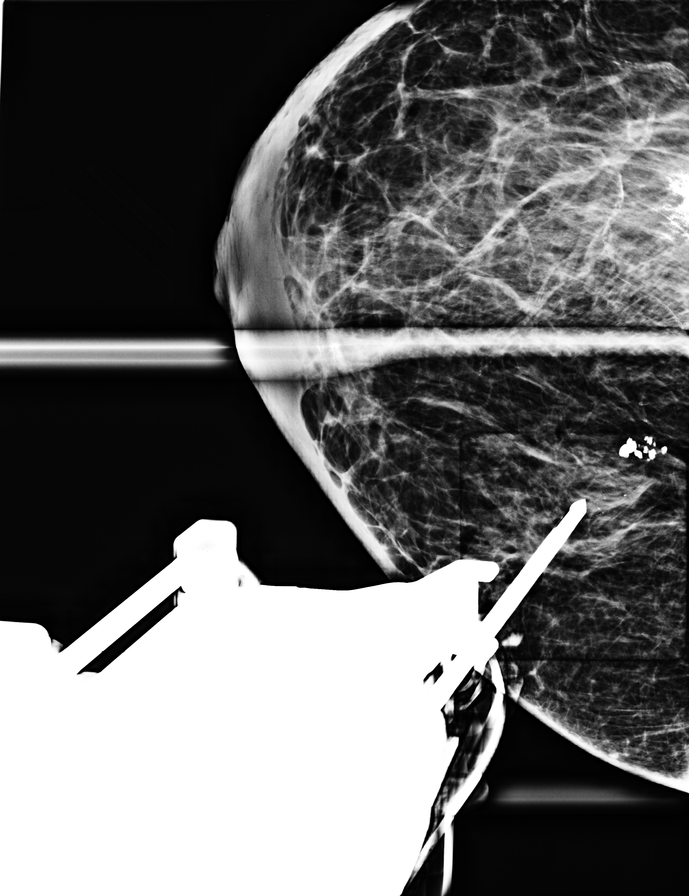

[L CC (2 of 7)]
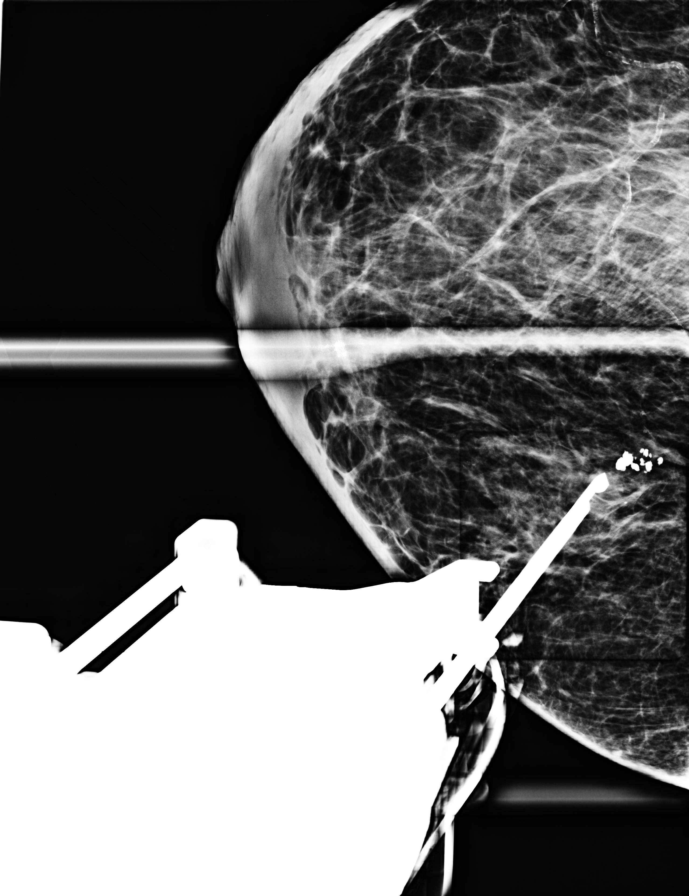

[L CC (3 of 7)]
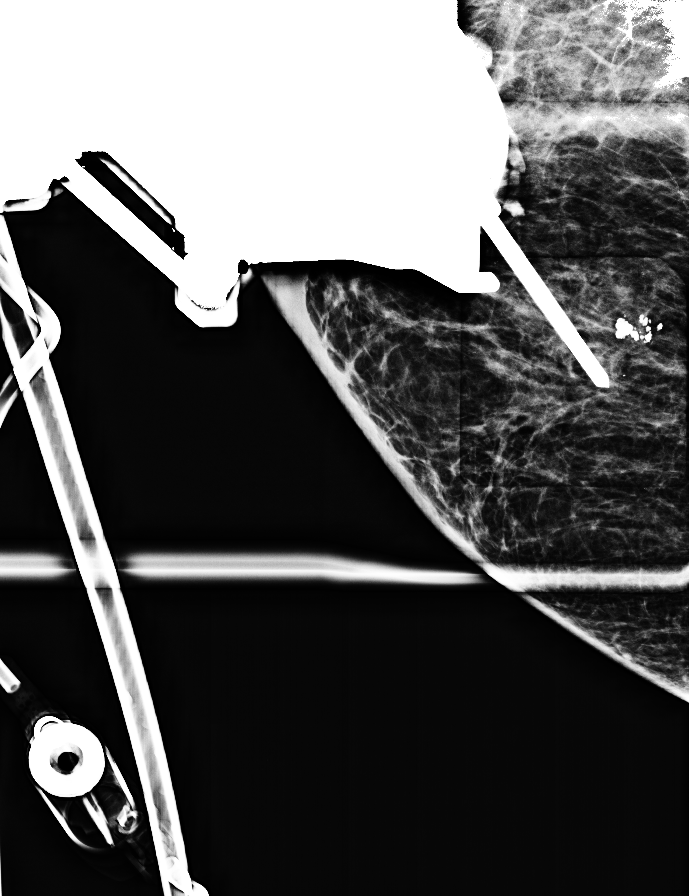

[L CC (4 of 7)]
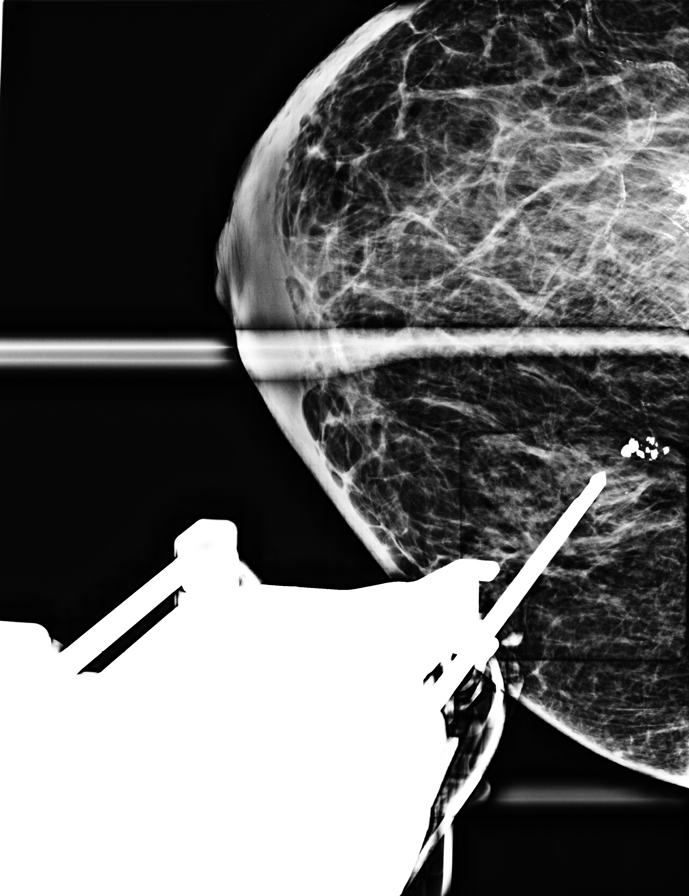

[L CC (5 of 7)]
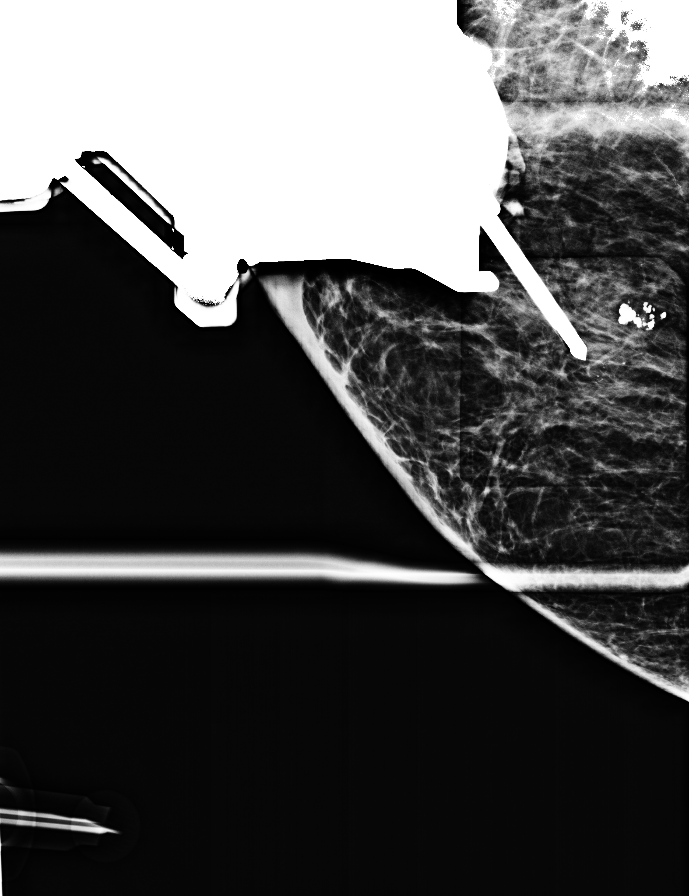

[L CC (6 of 7)]
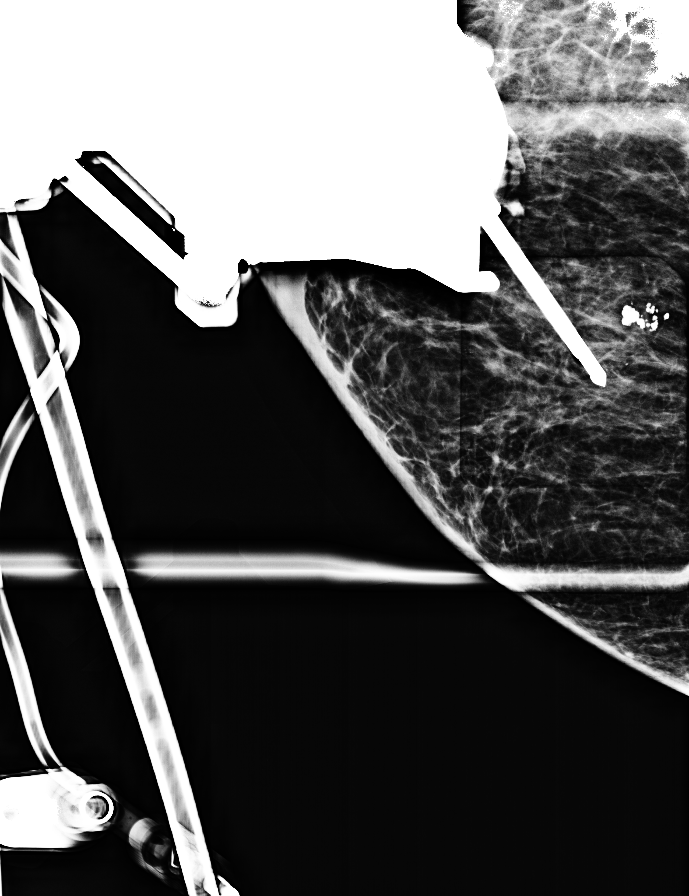

[L CC (7 of 7)]
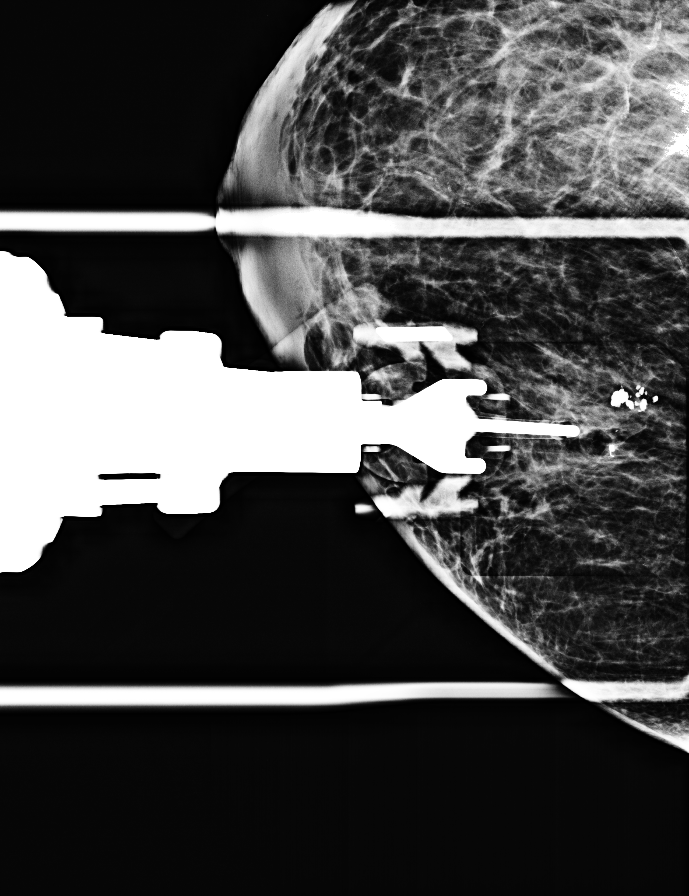

[L CC tomo · tomo slice 31/60.0]
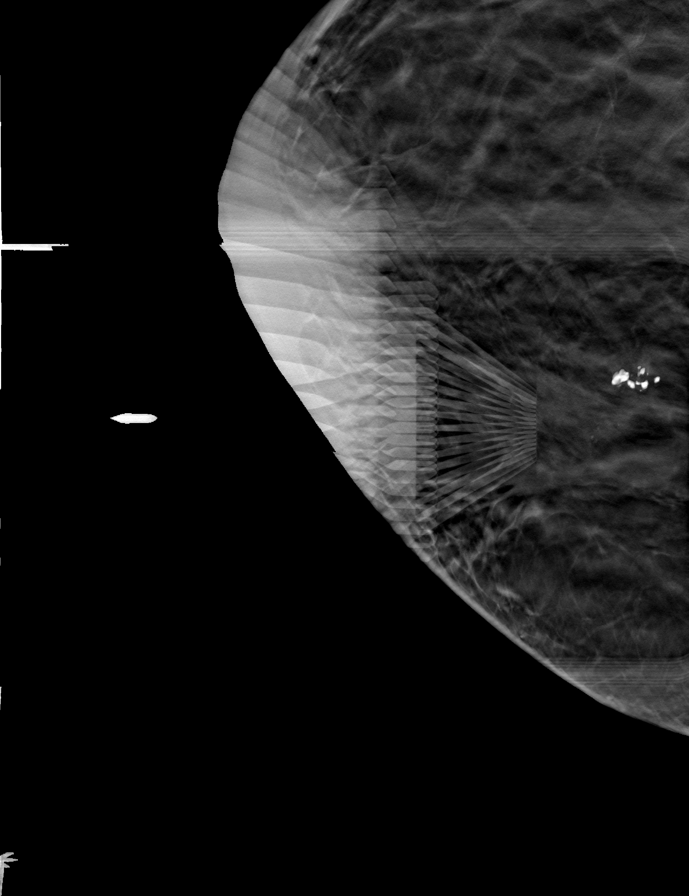

[8 of 17 positions shown; findings below may reference images not displayed]



Using sterile technique and 1% Lidocaine as local anesthetic, under
stereotactic guidance, a 9 gauge vacuum assisted device was used to
perform core needle biopsy of the recently demonstrated 7 mm group
of microcalcifications in the upper-outer quadrant of the left
breast using a cephalad approach. Specimen radiograph was performed
showing multiple calcifications in multiple specimens. Specimens
with calcifications are identified for pathology.

At the conclusion of the procedure, a coil shaped tissue marker clip
was deployed into the biopsy cavity. Follow-up 2-view mammogram was
performed and dictated separately.
IMPRESSION: Stereotactic-guided biopsy of a 7 mm group of suspicious
microcalcifications in the upper-outer left breast. No apparent
complications.

## 2024-07-22 DEATH — deceased
